# Patient Record
Sex: Male | Born: 1960 | Race: White | Hispanic: No | Marital: Married | Smoking: Never smoker
Health system: Southern US, Community
[De-identification: ages and names within clinical notes are randomized; demographics above are authoritative.]

## PROBLEM LIST (undated history)

## (undated) DIAGNOSIS — G510 Bell's palsy: Secondary | ICD-10-CM

## (undated) DIAGNOSIS — T7840XA Allergy, unspecified, initial encounter: Secondary | ICD-10-CM

## (undated) DIAGNOSIS — E785 Hyperlipidemia, unspecified: Secondary | ICD-10-CM

## (undated) DIAGNOSIS — R739 Hyperglycemia, unspecified: Secondary | ICD-10-CM

## (undated) HISTORY — DX: Allergy, unspecified, initial encounter: T78.40XA

## (undated) HISTORY — PX: VASECTOMY: SHX75

## (undated) HISTORY — PX: BACK SURGERY: SHX140

## (undated) HISTORY — DX: Bell's palsy: G51.0

---

## 2003-10-29 ENCOUNTER — Emergency Department (HOSPITAL_COMMUNITY): Admission: EM | Admit: 2003-10-29 | Discharge: 2003-10-29 | Payer: Self-pay | Admitting: Emergency Medicine

## 2007-03-17 ENCOUNTER — Encounter: Admission: RE | Admit: 2007-03-17 | Discharge: 2007-03-17 | Payer: Self-pay | Admitting: Family Medicine

## 2013-07-22 ENCOUNTER — Other Ambulatory Visit: Payer: Self-pay | Admitting: Family Medicine

## 2013-07-22 DIAGNOSIS — N509 Disorder of male genital organs, unspecified: Secondary | ICD-10-CM

## 2013-07-26 ENCOUNTER — Ambulatory Visit
Admission: RE | Admit: 2013-07-26 | Discharge: 2013-07-26 | Disposition: A | Discharge status: Home / Self Care | Payer: Managed Care, Other (non HMO) | Source: Ambulatory Visit | Attending: Family Medicine | Admitting: Family Medicine

## 2013-07-26 DIAGNOSIS — N509 Disorder of male genital organs, unspecified: Secondary | ICD-10-CM

## 2015-12-21 ENCOUNTER — Ambulatory Visit (INDEPENDENT_AMBULATORY_CARE_PROVIDER_SITE_OTHER): Payer: Managed Care, Other (non HMO) | Admitting: Family Medicine

## 2015-12-21 VITALS — BP 116/78 | HR 74 | Temp 98.7°F | Resp 18 | Ht 71.0 in | Wt 219.0 lb

## 2015-12-21 DIAGNOSIS — L923 Foreign body granuloma of the skin and subcutaneous tissue: Secondary | ICD-10-CM | POA: Insufficient documentation

## 2015-12-21 NOTE — Patient Instructions (Addendum)
Thank you for coming in today. That thorn can wait till after your golf trip.  We can remove it as can your PCP or your hand surgeon.   Return sooner if it becomes red and painful.     IF you received an x-ray today, you will receive an invoice from [facility]. Please contact [facility] at [phone] with questions or concerns regarding your invoice.   IF you received labwork today, you will receive an invoice from [facility]. Please contact [facility] at [phone] with questions or concerns regarding your invoice.   Our billing staff will not be able to assist you with questions regarding bills from these companies.  You will be contacted with the lab results as soon as they are available. The fastest way to get your results is to activate your My Chart account. Instructions are located on the last page of this paperwork. If you have not heard from us regarding the results in 2 weeks, please contact this office.

## 2015-12-21 NOTE — Progress Notes (Signed)
    Jeff KneeRobert Perez is a 55 y.o. male who presents to [facility] today for foreign body. 3 months ago patient had a throne become lodged in the radial side of the right index finger at the proximal torsion of the middle phalanx. He had a few days of mild redness and pain which has since resolved. Now he has a persistent bump at the same spot. It is very occasionally tender but does not particularly bother him very much. The coworker stated that this has to be dealt with right away because one of her friends died from an infected finger in the past.  He notes that he has an important golf trip coming up Monday (3 days from now) would like to delay surgical management if possible.   Past Medical History:  Diagnosis Date  . Allergy    Past Surgical History:  Procedure Laterality Date  . VASECTOMY     Social History  Substance Use Topics  . Smoking status: Never Smoker  . Smokeless tobacco: Never Used  . Alcohol use Yes   ROS as above Medications: No current outpatient prescriptions on file.   No current facility-administered medications for this visit.    No Known Allergies   Exam:  BP 116/78 (BP Location: Right Arm, Patient Position: Sitting, Cuff Size: Small)   Pulse 74   Temp 98.7 F (37.1 C) (Oral)   Resp 18   Ht 5\' 11"  (1.803 m)   Wt 219 lb (99.3 kg)   SpO2 98%   BMI 30.54 kg/m  Gen: Well NAD Right index finger. Small flesh-colored 1-2 mm papule radial side of the proximal end of the middle phalanx. Nontender no discharge or erythema. Normal finger motion.  No results found for this or any previous visit (from the past 24 hour(s)). No results found.  Assessment and Plan: 55 y.o. male with foreign body.  No signs of infection today. I discussed with the patient that this can certainly wait until a better time in his life for removal. I presented him with several options including return to this clinic for removal or follow-up with his PCP or his existing hand surgeon. He  thinks he'll follow-up with his hand surgeon for this issue at some point in the future.  Discussed warning signs or symptoms. Please see discharge instructions. Patient expresses understanding.

## 2018-12-06 ENCOUNTER — Encounter (HOSPITAL_COMMUNITY): Payer: Self-pay

## 2018-12-06 ENCOUNTER — Other Ambulatory Visit: Payer: Self-pay

## 2018-12-06 ENCOUNTER — Emergency Department (HOSPITAL_COMMUNITY): Payer: Managed Care, Other (non HMO)

## 2018-12-06 ENCOUNTER — Observation Stay (HOSPITAL_COMMUNITY)
Admission: EM | Admit: 2018-12-06 | Discharge: 2018-12-07 | Disposition: A | Discharge status: Home / Self Care | Payer: Managed Care, Other (non HMO) | Attending: Family Medicine | Admitting: Family Medicine

## 2018-12-06 ENCOUNTER — Inpatient Hospital Stay (HOSPITAL_COMMUNITY): Payer: Managed Care, Other (non HMO)

## 2018-12-06 DIAGNOSIS — G510 Bell's palsy: Secondary | ICD-10-CM | POA: Diagnosis not present

## 2018-12-06 DIAGNOSIS — R29810 Facial weakness: Secondary | ICD-10-CM | POA: Diagnosis present

## 2018-12-06 DIAGNOSIS — I639 Cerebral infarction, unspecified: Secondary | ICD-10-CM

## 2018-12-06 DIAGNOSIS — R739 Hyperglycemia, unspecified: Secondary | ICD-10-CM | POA: Diagnosis not present

## 2018-12-06 DIAGNOSIS — E785 Hyperlipidemia, unspecified: Secondary | ICD-10-CM | POA: Insufficient documentation

## 2018-12-06 DIAGNOSIS — I6381 Other cerebral infarction due to occlusion or stenosis of small artery: Secondary | ICD-10-CM

## 2018-12-06 DIAGNOSIS — I10 Essential (primary) hypertension: Secondary | ICD-10-CM | POA: Diagnosis not present

## 2018-12-06 DIAGNOSIS — Z20828 Contact with and (suspected) exposure to other viral communicable diseases: Secondary | ICD-10-CM | POA: Insufficient documentation

## 2018-12-06 DIAGNOSIS — Z79899 Other long term (current) drug therapy: Secondary | ICD-10-CM | POA: Diagnosis not present

## 2018-12-06 HISTORY — DX: Hyperlipidemia, unspecified: E78.5

## 2018-12-06 HISTORY — DX: Hyperglycemia, unspecified: R73.9

## 2018-12-06 LAB — I-STAT CHEM 8, ED
BUN: 14 mg/dL (ref 6–20)
Calcium, Ion: 1.24 mmol/L (ref 1.15–1.40)
Chloride: 103 mmol/L (ref 98–111)
Creatinine, Ser: 0.7 mg/dL (ref 0.61–1.24)
Glucose, Bld: 99 mg/dL (ref 70–99)
HCT: 40 % (ref 39.0–52.0)
Hemoglobin: 13.6 g/dL (ref 13.0–17.0)
Potassium: 3.8 mmol/L (ref 3.5–5.1)
Sodium: 140 mmol/L (ref 135–145)
TCO2: 25 mmol/L (ref 22–32)

## 2018-12-06 LAB — COMPREHENSIVE METABOLIC PANEL
ALT: 32 U/L (ref 0–44)
AST: 22 U/L (ref 15–41)
Albumin: 4.7 g/dL (ref 3.5–5.0)
Alkaline Phosphatase: 50 U/L (ref 38–126)
Anion gap: 10 (ref 5–15)
BUN: 16 mg/dL (ref 6–20)
CO2: 24 mmol/L (ref 22–32)
Calcium: 9 mg/dL (ref 8.9–10.3)
Chloride: 104 mmol/L (ref 98–111)
Creatinine, Ser: 0.78 mg/dL (ref 0.61–1.24)
GFR calc Af Amer: >60 mL/min (ref >60)
GFR calc non Af Amer: >60 mL/min (ref >60)
Glucose, Bld: 105 mg/dL — ABNORMAL HIGH (ref 70–99)
Potassium: 3.8 mmol/L (ref 3.5–5.1)
Sodium: 138 mmol/L (ref 135–145)
Total Bilirubin: 1 mg/dL (ref 0.3–1.2)
Total Protein: 7.1 g/dL (ref 6.5–8.1)

## 2018-12-06 LAB — URINALYSIS, ROUTINE W REFLEX MICROSCOPIC
Bilirubin Urine: NEGATIVE
Glucose, UA: NEGATIVE mg/dL
Hgb urine dipstick: NEGATIVE
Ketones, ur: NEGATIVE mg/dL
Leukocytes,Ua: NEGATIVE
Nitrite: NEGATIVE
Protein, ur: NEGATIVE mg/dL
Specific Gravity, Urine: 1.01 (ref 1.005–1.030)
pH: 8 (ref 5.0–8.0)

## 2018-12-06 LAB — PROTIME-INR
INR: 0.9 (ref 0.8–1.2)
Prothrombin Time: 12.2 seconds (ref 11.4–15.2)

## 2018-12-06 LAB — DIFFERENTIAL
Abs Immature Granulocytes: 0.05 10*3/uL (ref 0.00–0.07)
Basophils Absolute: 0 10*3/uL (ref 0.0–0.1)
Basophils Relative: 1 %
Eosinophils Absolute: 0.1 10*3/uL (ref 0.0–0.5)
Eosinophils Relative: 2 %
Immature Granulocytes: 1 %
Lymphocytes Relative: 30 %
Lymphs Abs: 1.6 10*3/uL (ref 0.7–4.0)
Monocytes Absolute: 0.5 10*3/uL (ref 0.1–1.0)
Monocytes Relative: 9 %
Neutro Abs: 2.9 10*3/uL (ref 1.7–7.7)
Neutrophils Relative %: 57 %

## 2018-12-06 LAB — CBC
HCT: 40 % (ref 39.0–52.0)
Hemoglobin: 13.3 g/dL (ref 13.0–17.0)
MCH: 31.6 pg (ref 26.0–34.0)
MCHC: 33.3 g/dL (ref 30.0–36.0)
MCV: 95 fL (ref 80.0–100.0)
Platelets: 186 10*3/uL (ref 150–400)
RBC: 4.21 MIL/uL — ABNORMAL LOW (ref 4.22–5.81)
RDW: 11.9 % (ref 11.5–15.5)
WBC: 5.1 10*3/uL (ref 4.0–10.5)
nRBC: 0 % (ref 0.0–0.2)

## 2018-12-06 LAB — RAPID URINE DRUG SCREEN, HOSP PERFORMED
Amphetamines: NOT DETECTED
Barbiturates: NOT DETECTED
Benzodiazepines: NOT DETECTED
Cocaine: NOT DETECTED
Opiates: NOT DETECTED
Tetrahydrocannabinol: NOT DETECTED

## 2018-12-06 LAB — SARS CORONAVIRUS 2 (TAT 6-24 HRS): SARS Coronavirus 2: NEGATIVE

## 2018-12-06 LAB — APTT: aPTT: 28 seconds (ref 24–36)

## 2018-12-06 MED ORDER — SODIUM CHLORIDE 0.9 % IV SOLN: 100.0000 mL/h | INTRAVENOUS | Status: DC

## 2018-12-06 MED ORDER — ACETAMINOPHEN 160 MG/5ML PO SOLN: 650.0000 mg | ORAL | Status: DC | PRN

## 2018-12-06 MED ORDER — SODIUM CHLORIDE 0.9 % IV BOLUS
500.0000 mL | Freq: Once | INTRAVENOUS | Status: AC
  Administered 2018-12-06: 500 mL via INTRAVENOUS

## 2018-12-06 MED ORDER — ASPIRIN 81 MG PO CHEW
324.0000 mg | CHEWABLE_TABLET | Freq: Once | ORAL | Status: AC
  Administered 2018-12-06: 324 mg via ORAL
  Filled 2018-12-06: qty 4

## 2018-12-06 MED ORDER — SODIUM CHLORIDE (PF) 0.9 % IJ SOLN
INTRAMUSCULAR | Status: AC
  Administered 2018-12-06: 20:00:00
  Filled 2018-12-06: qty 50

## 2018-12-06 MED ORDER — IOHEXOL 350 MG/ML SOLN
80.0000 mL | Freq: Once | INTRAVENOUS | Status: AC | PRN
  Administered 2018-12-06: 80 mL via INTRAVENOUS

## 2018-12-06 MED ORDER — ACETAMINOPHEN 650 MG RE SUPP: 650.0000 mg | RECTAL | Status: DC | PRN

## 2018-12-06 MED ORDER — ACETAMINOPHEN 325 MG PO TABS
650.0000 mg | ORAL_TABLET | ORAL | Status: DC | PRN
  Administered 2018-12-07 (×2): 650 mg via ORAL
  Filled 2018-12-06 (×3): qty 2

## 2018-12-06 NOTE — H&P (Addendum)
History and Physical  Jeff Perez URK:[id number] DOB: May 15, 1960 DOA: 12/06/2018  PCP: System, Pcp Not In   Chief Complaint: Left facial weakness  HPI:  58 year old man PMH hyperlipidemia, hyperglycemia, PRESENTED to the emergency department at [facility] long for left-sided facial weakness.  Admitted for stroke.  Patient has been feeling well lately, no issues yesterday.  He got up this morning and noticed what he thought was swelling of his lip.  He went for a long walk as it is typical for him.  He felt well during this with no weakness.  However he noticed increasing left facial weakness, called his physician who directed him to the emergency department.  Left facial weakness has progressed and he has difficulty closing his left eye now.  No difficulty speaking.  No weakness of arm or leg.  Some tingling right side.  No other complaints.  Chart review: . No recent hospitalizations  ED Course: Treated with IV fluids.  CT head negative but MRI did reveal stroke.  EDP exam showed left-sided facial weakness and mild decrease in right grip strength.  Review of Systems:  Negative for fever, visual changes, sore throat, rash, muscle aches, chest pain, shortness of breath, dysuria, bleeding, nausea, vomiting, abdominal pain.  PMH . Hyperlipidemia . Hyperglycemia untreated  PSH . Vasectomy  Social History . Drinks 2 shots of liquor a day and a glass of wine a day no drug use, non-smoker  Allergies . None  Family history includes: . Mother with diabetes   Meds include: . Lipitor 40 mg nightly  Physicial Exam   Vitals:  . 98.1, 22, 72, 73, 135/89, 98%  Constitutional:   . Appears calm and comfortable Eyes:  . pupils and irises appear normal . Normal lids  ENMT:  . grossly normal hearing  . Lips appear normal Neck:  . neck appears normal, no masses . no thyromegaly Respiratory:  . CTA bilaterally, no w/r/r.  . Respiratory effort normal.  Cardiovascular:  . RRR, no  m/r/g . No LE extremity edema   Abdomen:  . Abdomen appears normal; no tenderness or masses . No hernia noted Musculoskeletal:  . Grossly normal tone and strength all extremities, no focal deficit noted Skin:  . No rashes, lesions, ulcers . palpation of skin: no induration or nodules Neurologic:  . Left-sided facial weakness noted, inability to completely close left eye . Sensation bilateral lower extremities intact grossly Psychiatric:  . Mental status o Mood, affect appropriate . judgment and insight appear intact    I have personally reviewed following labs and imaging studies  Labs:  Marland Kitchen SARS-CoV-2 pending . BMP unremarkable . LFTs unremarkable . CBC unremarkable . UDS negative  Imaging studies:   CT head negative  Medical tests:   EKG independently reviewed: Sinus rhythm, LVH  Significant Hospital Events   . 10/5 admitted for acute stroke with left-sided facial weakness   Consults:  . Neurology   Procedures:  .   Significant Diagnostic Tests:  . 10/5 MRI brain: Possible subcentimeter acute right paramedian pontine infarct.   Micro Data:  .    Antimicrobials:  .   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  No need for CBG checks at this point.  Follow-up hemoglobin A1c.   DVT prophylaxis: early ambulation Code Status: Full Family Communication: wife at bedside Consults called: neurology  Time spent: 60 minutes  [name], MD  [facility] Direct contact: see [email or web address]  7PM-7AM contact night coverage as below   1. Check the care team in [facility] and look for a) attending/consulting [facility] provider listed and b) the [facility] team listed 2. Log into [email or web address] and use [facility]'s universal  password to access. If you do not have the password, please contact the hospital operator. 3. Locate the [facility] provider you are looking for under [facility] and page to a number that you can be directly reached. 4. If you still have difficulty reaching the provider, please page the [facility] (Director on Call) for the Hospitalists listed on amion for assistance.  Severity of Illness: The appropriate patient status for this patient is INPATIENT. Inpatient status is judged to be reasonable and necessary in order to provide the required intensity of service to ensure the patient's safety. The patient's presenting symptoms, physical exam findings, and initial radiographic and laboratory data in the context of their chronic comorbidities is felt to place them at high risk for further clinical deterioration. Furthermore, it is not anticipated that the patient will be medically stable for discharge from the hospital within 2 midnights of admission. The following factors support the patient status of inpatient.   " The patient's presenting symptoms include left facial weakness. " The worrisome physical exam findings include left facial weakness. " The initial radiographic and laboratory data are worrisome because of suspected stroke. " The chronic co-morbidities include hyperlipidemia.   * I certify that at the point of admission it is my clinical judgment that the patient will require inpatient hospital care spanning beyond 2 midnights from the point of admission due to high intensity of service, high risk for further deterioration and high frequency of surveillance required.*   12/06/2018, 5:48 PM   Active Problems:   CVA (cerebral vascular accident) ([place])   Hyperlipidemia

## 2018-12-06 NOTE — Consult Note (Addendum)
Neurology Consultation  Reason for Consult: Stroke Referring Physician: Dr. Irene LimboGoodrich  History is obtained from: Patient  HPI: Jeff KneeRobert Perez is a 58 y.o. male with hyperlipidemia and hyperglycemia as past medical history.  Patient states that he went to sleep at 2100 hrs. last night feeling fine.  When he woke up he felt as though the her left side of his lip felt fat.  As he was driving he felt as though his left eye was slightly droopy also.  There was also noted left facial droop.  In addition, noted numbness of the right hand and leg that has nearly resolved. Initial EDP exam concerning for right grip and UE subtle weakness.  MRI brain done that showed a possible pontine infarct. Neurology consulted for stroke. Denies family h/o strokes. Denies family h/o MI. Denies prior personal history of stroke.   ED course CT head and MRI head   LKW: 2100 hrs. on 12/05/2018 tpa given?: no, out of window for TPA Premorbid modified Rankin scale (mRS): 0 NIH stroke score of 3 ROS: ROS was performed and is negative except as noted in the HPI.   Past Medical History:  Diagnosis Date  . Allergy   . Hyperglycemia   . Hyperlipidemia     Family History  Problem Relation Age of Onset  . Diabetes Mother   . Stroke Mother   . Cancer Father   . Cancer Maternal Grandfather   . Diabetes Sister      Social History:   reports that he has never smoked. He has never used smokeless tobacco. He reports current alcohol use of about 21.0 standard drinks of alcohol per week. He reports that he does not use drugs.  Medications  Current Facility-Administered Medications:  .  [COMPLETED] sodium chloride 0.9 % bolus 500 mL, 500 mL, Intravenous, Once, Stopped at 12/06/18 1507 **FOLLOWED BY** 0.9 %  sodium chloride infusion, 100 mL/hr, Intravenous, Continuous, Gerhard MunchLockwood, Darral, MD  Current Outpatient Medications:  .  atorvastatin (LIPITOR) 40 MG tablet, Take 40 mg by mouth daily., Disp: , Rfl:     Exam: Current vital signs: BP (!) 148/92   Pulse 73   Temp 98.1 F (36.7 C)   Resp (!) 22   Ht 5\' 11"  (1.803 m)   Wt 97.1 kg   SpO2 95%   BMI 29.85 kg/m  Vital signs in last 24 hours: Temp:  [98.1 F (36.7 C)] 98.1 F (36.7 C) (10/05 1027) Pulse Rate:  [55-73] 73 (10/05 1730) Resp:  [13-22] 22 (10/05 1730) BP: (127-166)/(88-97) 148/92 (10/05 1730) SpO2:  [95 %-100 %] 95 % (10/05 1730) Weight:  [97.1 kg] 97.1 kg (10/05 1027)  Physical Exam  Constitutional: Appears well-developed and well-nourished.  Psych: Affect appropriate to situation Eyes: No scleral injection HENT: No OP obstrucion Head: Normocephalic.  Cardiovascular: Normal rate and regular rhythm.  Respiratory: Effort normal, non-labored breathing GI: Soft.  No distension. There is no tenderness.  Skin: WDI  Neuro: Mental Status: Patient is awake, alert, oriented to person, place, month, year, and situation. Patient is able to give a clear and coherent history. No signs of aphasia or neglect Cranial Nerves: II: Visual Fields are full.  III,IV, VI: EOMI without ptosis or diploplia. Pupils equal, round and reactive to light V: Facial sensation is symmetric to temperature VII: Patient has decreased movement of frontal temporalis muscle, along with nasolabial fold and facial droop VIII: hearing is intact to voice X: Palat elevates symmetrically XI: Shoulder shrug is symmetric. XII: tongue is  midline without atrophy or fasciculations.  Motor: Tone is normal. Bulk is normal. 5/5 strength was present in all four extremities.  Sensory: Mild diminished sensation of Rt hemibody waxing and waning. Deep Tendon Reflexes: 2+ and symmetric in the biceps and patellae.  Plantars: Toes are downgoing bilaterally.  Cerebellar: FNF and HKS are intact bilaterally  Labs I have reviewed labs in epic and the results pertinent to this consultation are:   CBC    Component Value Date/Time   WBC 5.1 12/06/2018 1121    RBC 4.21 (L) 12/06/2018 1121   HGB 13.6 12/06/2018 1129   HCT 40.0 12/06/2018 1129   PLT 186 12/06/2018 1121   MCV 95.0 12/06/2018 1121   MCH 31.6 12/06/2018 1121   MCHC 33.3 12/06/2018 1121   RDW 11.9 12/06/2018 1121   LYMPHSABS 1.6 12/06/2018 1121   MONOABS 0.5 12/06/2018 1121   EOSABS 0.1 12/06/2018 1121   BASOSABS 0.0 12/06/2018 1121    CMP     Component Value Date/Time   NA 140 12/06/2018 1129   K 3.8 12/06/2018 1129   CL 103 12/06/2018 1129   CO2 24 12/06/2018 1121   GLUCOSE 99 12/06/2018 1129   BUN 14 12/06/2018 1129   CREATININE 0.70 12/06/2018 1129   CALCIUM 9.0 12/06/2018 1121   PROT 7.1 12/06/2018 1121   ALBUMIN 4.7 12/06/2018 1121   AST 22 12/06/2018 1121   ALT 32 12/06/2018 1121   ALKPHOS 50 12/06/2018 1121   BILITOT 1.0 12/06/2018 1121   GFRNONAA >60 12/06/2018 1121   GFRAA >60 12/06/2018 1121   Imaging I have reviewed the images obtained: CT-scan of the brain- no acute abnormalities are identified to explain patient's symptoms MRI examination of the brain- subcentimeter acute right paramedian pontine infarct  CTA of head and neck-pending  Echocardiogram-pending  IMA globin A1c and LDL pending  Felicie Morn PA-C [facility] [phone]  M-F  (9:00 am- 5:00 PM)  12/06/2018, 5:46 PM    Assessment: This is a 58 year old male presented to the hospital secondary to new onset left facial droop and right sided weakness upon awakening.   MRI confirms a subcentimeter acute right paramedian pontine infarct.  At this point patient is not on aspirin and will need to be placed on aspirin daily.  Impression: -Left facial droop -Stroke  Recommend # MRI of the brain without contrast #CTA of head and neck-ordered #Transthoracic Echo,   # Start patient on ASA 325mg  daily,   #Start or continue Atorvastatin 80 mg/other high intensity statin # BP goal: permissive HTN upto 220/120 mmHg # HBAIC and Lipid profile # Telemetry monitoring # Frequent  neuro checks # NPO until passes stroke swallow screen # please page stroke NP  Or  PA  Or MD from 8am -4 pm  as this patient from this time will be  followed by the stroke.   You can look them up on [email or web address]  Password [facility]   Attending Neurohospitalist Addendum Patient seen and examined with APP/Resident. Agree with the history and physical as documented above. Agree with the plan as documented, which I helped formulate. I have independently reviewed the chart, obtained history, review of systems and examined the patient.I have personally reviewed pertinent head/neck/spine imaging (CT/MRI). Exam findings of whole left face weakness - including upper face. Brainstem stroke more likely given crossed findings on face and body and imgaing confirms pontine stroke.  Less likely isolated Bells given imaging, and right hemibody symptoms.  Please feel free to call with any  questions. --- Amie Portland, MD [facility] Pager: [phone]  If 7pm to 7am, please call on call as listed on AMION.

## 2018-12-06 NOTE — ED Triage Notes (Signed)
Pt woke up with numbness on right side of face, mostly in lip and eyelid. Pt is otherwise negative on stroke scale . Pt speech is clear. No previous hx of bells palsy. Pt woke up at approximately 0400.

## 2018-12-06 NOTE — ED Provider Notes (Signed)
[facility]-EMERGENCY DEPT Provider Note   CSN: [id number] Arrival date & time: 12/06/18  1017     History   Chief Complaint Chief Complaint  Patient presents with  . Facial Droop    HPI Jeff KneeRobert Perez is a 58 y.o. male.     HPI Patient presents with new left-sided facial droop.  Patient was well yesterday, went to sleep. He awoke this morning a few hours prior to ED arrival, noticed odd sensation in the right side of his face, particularly in the right superior lip. With some difficulty elevating his eyelid, he noticed these differences throughout the interval hours. He was able to exercise, but after going to work, did speak with the physician, he was sent here for evaluation. He has no history of stroke, cardiac disease, does take a statin for hyperlipidemia. No recent medication, diet, activity change. No new weakness in any extremity.  He does, however, have tingling sensation in the distal right toes and fingertips. Past Medical History:  Diagnosis Date  . Allergy     Patient Active Problem List   Diagnosis Date Noted  . Foreign body, granuloma, skin 12/21/2015    Past Surgical History:  Procedure Laterality Date  . VASECTOMY          Home Medications    Prior to Admission medications   Medication Sig Start Date End Date Taking? Authorizing Provider  atorvastatin (LIPITOR) 40 MG tablet Take 40 mg by mouth daily. 11/11/18   [provider]    Family History Family History  Problem Relation Age of Onset  . Diabetes Mother   . Stroke Mother   . Cancer Father   . Cancer Maternal Grandfather   . Diabetes Sister     Social History Social History   Tobacco Use  . Smoking status: Never Smoker  . Smokeless tobacco: Never Used  Substance Use Topics  . Alcohol use: Yes  . Drug use: No     Allergies   Patient has no known allergies.   Review of Systems Review of Systems  Constitutional:       Per HPI, otherwise  negative  HENT:       Per HPI, otherwise negative  Respiratory:       Per HPI, otherwise negative  Cardiovascular:       Per HPI, otherwise negative  Gastrointestinal: Negative for vomiting.  Endocrine:       Negative aside from HPI  Genitourinary:       Neg aside from HPI   Musculoskeletal:       Per HPI, otherwise negative  Skin: Negative.   Neurological: Positive for facial asymmetry. Negative for syncope.     Physical Exam Updated Vital Signs BP 135/89   Pulse 62   Temp 98.1 F (36.7 C)   Resp 20   Ht 5\' 11"  (1.803 m)   Wt 97.1 kg   SpO2 98%   BMI 29.85 kg/m   Physical Exam Vitals signs and nursing note reviewed.  Constitutional:      General: He is not in acute distress.    Appearance: He is well-developed.  HENT:     Head: Normocephalic and atraumatic.  Eyes:     Conjunctiva/sclera: Conjunctivae normal.  Cardiovascular:     Rate and Rhythm: Normal rate and regular rhythm.  Pulmonary:     Effort: Pulmonary effort is normal. No respiratory distress.     Breath sounds: No stridor.  Abdominal:     General: There  is no distension.  Skin:    General: Skin is warm and dry.  Neurological:     Mental Status: He is alert and oriented to person, place, and time.     Cranial Nerves: Facial asymmetry present.     Comments: Left facial droop with loss of nasolabial fold. Right grip strength is 4+/5, left 5/5 Sensation preserved throughout, neurologic exam otherwise unremarkable.      ED Treatments / Results  Labs (all labs ordered are listed, but only abnormal results are displayed) Labs Reviewed  CBC - Abnormal; Notable for the following components:      Result Value   RBC 4.21 (*)    All other components within normal limits  COMPREHENSIVE METABOLIC PANEL - Abnormal; Notable for the following components:   Glucose, Bld 105 (*)    All other components within normal limits  URINALYSIS, ROUTINE W REFLEX MICROSCOPIC - Abnormal; Notable for the following  components:   Color, Urine STRAW (*)    All other components within normal limits  SARS CORONAVIRUS 2 (TAT 6-24 HRS)  PROTIME-INR  APTT  DIFFERENTIAL  RAPID URINE DRUG SCREEN, HOSP PERFORMED  HIV ANTIBODY (ROUTINE TESTING W REFLEX)  HEMOGLOBIN A1C  LIPID PANEL  GLUCOSE, CAPILLARY  GLUCOSE, CAPILLARY  HIV4GL SAVE TUBE  I-STAT CHEM 8, ED    EKG EKG Interpretation  Date/Time:  Monday December 06 2018 13:03:16 EDT Ventricular Rate:  60 PR Interval:    QRS Duration: 96 QT Interval:  438 QTC Calculation: 438 R Axis:   -49 Text Interpretation:  Sinus rhythm LAD, consider left anterior fascicular block Abnormal R-wave progression, late transition Left ventricular hypertrophy No old tracing to compare Confirmed by Delora Fuel ([id number]) on 12/07/2018 2:04:09 AM   RadiologyCt Head Wo Contrast  Result Date: 12/06/2018 CLINICAL DATA:  Patient woke up this morning with numbness in right face. EXAM: CT HEAD WITHOUT CONTRAST TECHNIQUE: Contiguous axial images were obtained from the base of the skull through the vertex without intravenous contrast. COMPARISON:  None. FINDINGS: Brain: No evidence of acute infarction, hemorrhage, hydrocephalus, extra-axial collection or mass lesion/mass effect. Vascular: No hyperdense vessel or unexpected calcification. Skull: Normal. Negative for fracture or focal lesion. Sinuses/Orbits: No acute finding. Other: None. IMPRESSION: No acute abnormalities are identified to explain the patient's symptoms. Electronically Signed   By: Dorise Bullion III M.D   On: 12/06/2018 11:55   Mr Brain Wo Contrast  Result Date: 12/06/2018 CLINICAL DATA:  Right facial numbness. EXAM: MRI HEAD WITHOUT CONTRAST TECHNIQUE: Multiplanar, multiecho pulse sequences of the brain and surrounding structures were obtained without intravenous contrast. COMPARISON:  Head CT 12/06/2018 FINDINGS: Brain: There is a subcentimeter curvilinear focus of trace diffusion signal hyperintensity in the right  paramedian pons on the axial diffusion series (series 4, images 12 and 13) with suggestion of reduced ADC, however this is an area prone to artifact and an infarct is not clearly confirmed on the coronal diffusion sequence. There is no corresponding sold abnormality in this location on conventional sequences. The brain is otherwise normal in signal. The ventricles and sulci are normal. Vascular: Major intracranial vascular flow voids are preserved. Skull and upper cervical spine: Unremarkable bone marrow signal. Sinuses/Orbits: Unremarkable orbits. Paranasal sinuses and mastoid air cells are clear. Other: None. IMPRESSION: 1. Possible subcentimeter acute right paramedian pontine infarct. 2. Otherwise unremarkable appearance of the brain. Electronically Signed   By: Logan Bores M.D.   On: 12/06/2018 12:46    Procedures Procedures (including critical care time)  Medications Ordered in ED Medications  sodium chloride 0.9 % bolus 500 mL (has no administration in time range)    Followed by  0.9 %  sodium chloride infusion (has no administration in time range)     Initial Impression / Assessment and Plan / ED Course  I have reviewed the triage vital signs and the nursing notes.  Pertinent labs & imaging results that were available during my care of the patient were reviewed by me and considered in my medical decision making (see chart for details).    11:41 AM Head CT does not demonstrate mass, nor bleed.   On repeat exam the patient is accompanied by his wife.  We discussed the MRI findings and with his PE notable for L facial droop, R grip weakness, likelihood of stroke.  He has no other new notable complaints (though he does have some subjective difficulty opening his L eye - when distracted, this has normal function.)  I have also d/w neuro and given concern for CVA he will be transferred [facility]/M.  Aspirin started per rec's.  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

## 2018-12-06 NOTE — ED Notes (Signed)
Dr. Pfeiffer made aware of pt.  

## 2018-12-06 NOTE — ED Notes (Signed)
Patient returned from MRI.

## 2018-12-06 NOTE — ED Notes (Signed)
Patient transported to MRI 

## 2018-12-07 ENCOUNTER — Inpatient Hospital Stay (HOSPITAL_COMMUNITY): Payer: Managed Care, Other (non HMO)

## 2018-12-07 ENCOUNTER — Other Ambulatory Visit: Payer: Self-pay

## 2018-12-07 DIAGNOSIS — I6389 Other cerebral infarction: Secondary | ICD-10-CM

## 2018-12-07 DIAGNOSIS — Z79899 Other long term (current) drug therapy: Secondary | ICD-10-CM | POA: Diagnosis not present

## 2018-12-07 DIAGNOSIS — Z20828 Contact with and (suspected) exposure to other viral communicable diseases: Secondary | ICD-10-CM | POA: Diagnosis not present

## 2018-12-07 DIAGNOSIS — R29810 Facial weakness: Secondary | ICD-10-CM | POA: Diagnosis present

## 2018-12-07 DIAGNOSIS — I639 Cerebral infarction, unspecified: Secondary | ICD-10-CM | POA: Diagnosis present

## 2018-12-07 DIAGNOSIS — G510 Bell's palsy: Secondary | ICD-10-CM

## 2018-12-07 DIAGNOSIS — E785 Hyperlipidemia, unspecified: Secondary | ICD-10-CM | POA: Diagnosis not present

## 2018-12-07 DIAGNOSIS — R739 Hyperglycemia, unspecified: Secondary | ICD-10-CM | POA: Diagnosis not present

## 2018-12-07 DIAGNOSIS — I10 Essential (primary) hypertension: Secondary | ICD-10-CM | POA: Diagnosis not present

## 2018-12-07 LAB — HEMOGLOBIN A1C
Hgb A1c MFr Bld: 5.4 % (ref 4.8–5.6)
Mean Plasma Glucose: 108.28 mg/dL

## 2018-12-07 LAB — GLUCOSE, CAPILLARY
Glucose-Capillary: 83 mg/dL (ref 70–99)
Glucose-Capillary: 98 mg/dL (ref 70–99)

## 2018-12-07 LAB — LIPID PANEL
Cholesterol: 173 mg/dL (ref 0–200)
HDL: 85 mg/dL (ref >40)
LDL Cholesterol: 75 mg/dL (ref 0–99)
Total CHOL/HDL Ratio: 2 RATIO
Triglycerides: 63 mg/dL (ref <150)
VLDL: 13 mg/dL (ref 0–40)

## 2018-12-07 LAB — HIV ANTIBODY (ROUTINE TESTING W REFLEX): HIV Screen 4th Generation wRfx: NONREACTIVE

## 2018-12-07 LAB — ECHOCARDIOGRAM COMPLETE
Height: 71 in
Weight: 3424 oz

## 2018-12-07 MED ORDER — ENOXAPARIN SODIUM 40 MG/0.4ML ~~LOC~~ SOLN
40.0000 mg | SUBCUTANEOUS | Status: DC
  Administered 2018-12-07: 40 mg via SUBCUTANEOUS
  Filled 2018-12-07: qty 0.4

## 2018-12-07 MED ORDER — VALACYCLOVIR HCL 500 MG PO TABS
1000.0000 mg | ORAL_TABLET | Freq: Three times a day (TID) | ORAL | Status: DC
  Administered 2018-12-07: 17:00:00 1000 mg via ORAL
  Filled 2018-12-07: qty 2

## 2018-12-07 MED ORDER — SENNOSIDES-DOCUSATE SODIUM 8.6-50 MG PO TABS: 1.0000 | ORAL_TABLET | Freq: Every evening | ORAL | Status: DC | PRN

## 2018-12-07 MED ORDER — ARTIFICIAL TEARS OPHTHALMIC OINT
TOPICAL_OINTMENT | Freq: Every evening | OPHTHALMIC | Status: DC | PRN
  Filled 2018-12-07: qty 3.5

## 2018-12-07 MED ORDER — PREDNISONE 20 MG PO TABS: 60.0000 mg | ORAL_TABLET | Freq: Every day | ORAL | Status: DC

## 2018-12-07 MED ORDER — HYPROMELLOSE (GONIOSCOPIC) 2.5 % OP SOLN: 1.0000 [drp] | Freq: Four times a day (QID) | OPHTHALMIC | 12 refills | Status: DC | PRN

## 2018-12-07 MED ORDER — STROKE: EARLY STAGES OF RECOVERY BOOK
Freq: Once | Status: AC
  Administered 2018-12-07: 04:00:00
  Filled 2018-12-07: qty 1

## 2018-12-07 MED ORDER — HYPROMELLOSE (GONIOSCOPIC) 2.5 % OP SOLN
1.0000 [drp] | Freq: Four times a day (QID) | OPHTHALMIC | Status: DC | PRN
  Filled 2018-12-07: qty 15

## 2018-12-07 MED ORDER — ATORVASTATIN CALCIUM 40 MG PO TABS
40.0000 mg | ORAL_TABLET | Freq: Every day | ORAL | Status: DC
  Administered 2018-12-07: 10:00:00 40 mg via ORAL
  Filled 2018-12-07 (×2): qty 1

## 2018-12-07 MED ORDER — ASPIRIN EC 81 MG PO TBEC
81.0000 mg | DELAYED_RELEASE_TABLET | Freq: Every day | ORAL | Status: DC
  Administered 2018-12-07: 81 mg via ORAL
  Filled 2018-12-07: qty 1

## 2018-12-07 MED ORDER — CLOPIDOGREL BISULFATE 75 MG PO TABS
75.0000 mg | ORAL_TABLET | Freq: Every day | ORAL | Status: DC
  Administered 2018-12-07: 10:00:00 75 mg via ORAL
  Filled 2018-12-07: qty 1

## 2018-12-07 MED ORDER — PREDNISONE 20 MG PO TABS: 60.0000 mg | ORAL_TABLET | Freq: Every day | ORAL | 0 refills | Status: AC

## 2018-12-07 MED ORDER — VALACYCLOVIR HCL 1 G PO TABS: 1000.0000 mg | ORAL_TABLET | Freq: Three times a day (TID) | ORAL | 0 refills | Status: AC

## 2018-12-07 MED ORDER — PREDNISONE 20 MG PO TABS
60.0000 mg | ORAL_TABLET | Freq: Every day | ORAL | Status: DC
  Administered 2018-12-07: 17:00:00 60 mg via ORAL
  Filled 2018-12-07: qty 3

## 2018-12-07 MED ORDER — ARTIFICIAL TEARS OPHTHALMIC OINT: TOPICAL_OINTMENT | Freq: Every evening | OPHTHALMIC | 1 refills | Status: AC | PRN

## 2018-12-07 MED ORDER — ASPIRIN 325 MG PO TABS: 325.0000 mg | ORAL_TABLET | Freq: Every day | ORAL | Status: DC

## 2018-12-07 MED ORDER — ASPIRIN 300 MG RE SUPP: 300.0000 mg | Freq: Every day | RECTAL | Status: DC

## 2018-12-07 NOTE — Progress Notes (Signed)
Patient being discharged home with self care. Education and instructions provided to . All belongings with pt. IV removed. CCMD notified. Patient leaving unit ambulating with RN.

## 2018-12-07 NOTE — Discharge Summary (Signed)
Physician Discharge Summary  Jeff Perez VVO:[id number] DOB: [birth year] DOA: 12/06/2018  PCP: System, Pcp Not In  Admit date: 12/06/2018 Discharge date: 12/07/2018  Admitted From: Home Disposition: Home  Recommendations for Outpatient Follow-up:  1. Follow up with PCP in 1 week 2. Follow up with neurology 3. Please follow up on the following pending results: None  Home Health: None Equipment/Devices: None  Discharge Condition: Stable CODE STATUS: Full code Diet recommendation: Heart healthy   Brief/Interim Summary:  Admission HPI written by Standley Brooking, MD   HPI:  58 year old man PMH hyperlipidemia, hyperglycemia, PRESENTED to the emergency department at [facility] long for left-sided facial weakness.  Admitted for stroke.  Patient has been feeling well lately, no issues yesterday.  He got up this morning and noticed what he thought was swelling of his lip.  He went for a long walk as it is typical for him.  He felt well during this with no weakness.  However he noticed increasing left facial weakness, called his physician who directed him to the emergency department.  Left facial weakness has progressed and he has difficulty closing his left eye now.  No difficulty speaking.  No weakness of arm or leg.  Some tingling right side.  No other complaints.   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 Repeat MRI brain significant for no acute infarct. Clinically consistent with Bell Palsy. Prescribed a steroid and valacyclovir course for 7 days on discharge. Outpatient follow-up with PCP and neurology. Transthoracic Echocardiogram results below.  Hyperlipidemia -Continue Lipitor  Hyperglycemia Hemoglobin A1C of 5.4%.  Discharge Diagnoses:  Active Problems:   Bell palsy   Hyperlipidemia    Discharge Instructions  Discharge Instructions    Ambulatory referral to Neurology   Complete by: As directed    Any provider for bell's palsy consult within 4 weeks. Thanks.     Allergies as of 12/07/2018   No Known Allergies     Medication List    TAKE these medications   atorvastatin 40 MG tablet Commonly known as: LIPITOR Take 40 mg by mouth daily.   predniSONE 20 MG tablet Commonly known as: DELTASONE Take 3 tablets (60 mg total) by mouth daily with breakfast for 6 days. Start taking on: December 08, 2018   valACYclovir 1000 MG tablet Commonly known as: VALTREX Take 1 tablet (1,000 mg total) by mouth 3 (three) times daily for 7 days.      Follow-up Information    [facility]. Schedule an appointment as soon as possible for a visit in 4 week(s).   Specialty: Neurology Contact information: [address] [place] [address] [phone]         No Known Allergies  Consultations:  Neurology   Procedures/Studies: Ct Angio Head W Or Wo Contrast  Result Date: 12/06/2018 CLINICAL DATA:  Left facial droop. Right upper extremity weakness. Possible infarct right pons on MRI today. EXAM: CT ANGIOGRAPHY HEAD AND NECK TECHNIQUE: Multidetector CT imaging of the head and neck was performed using the standard protocol during bolus administration of intravenous contrast. Multiplanar CT image reconstructions and MIPs were obtained to evaluate the vascular anatomy. Carotid stenosis measurements (when applicable) are obtained utilizing NASCET criteria, using the distal internal carotid diameter as the denominator. CONTRAST:  26mL OMNIPAQUE IOHEXOL 350 MG/ML SOLN COMPARISON:  MRI head 12/06/2018 FINDINGS: CT HEAD FINDINGS Brain: No evidence of acute infarction, hemorrhage, hydrocephalus, extra-axial collection or mass lesion/mass  effect. Vascular: Negative for hyperdense vessel Skull: Negative Sinuses: Negative Orbits: Negative Review of the MIP images confirms the above findings CTA NECK FINDINGS Aortic arch: Standard branching. Imaged portion shows no  evidence of aneurysm or dissection. No significant stenosis of the major arch vessel origins. Right carotid system: Normal right carotid. No atherosclerotic disease dissection or stenosis Left carotid system: Normal left carotid. No atherosclerotic disease, stenosis, or dissection Vertebral arteries: Right vertebral artery dominant and widely patent. Small left vertebral artery patent without significant stenosis. Skeleton: Mild disc degeneration and spurring C6-7. Periapical lucency around left lower molar. Other neck: Negative for mass or adenopathy in the neck. Upper chest: Negative Review of the MIP images confirms the above findings CTA HEAD FINDINGS Anterior circulation: Internal carotid artery widely patent bilaterally. 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 Anterior and middle cerebral arteries widely patent without significant stenosis. Posterior circulation: Right vertebral artery dominant. Small left vertebral artery patent to the basilar. PICA patent bilaterally. Small proximal basilar due to persistent trigeminal artery on the left. Superior cerebellar and posterior cerebral arteries patent bilaterally without stenosis. Venous sinuses: Normal venous enhancement. Anatomic variants: None Review of the MIP images confirms the above findings IMPRESSION: 1. No significant carotid or vertebral artery stenosis in the neck 2. No significant intracranial large vessel occlusion or significant stenosis. 3. Persistent trigeminal artery on the left, a congenital variation. Electronically Signed   By: Marlan Palau M.D.   On: 12/06/2018 20:43   Ct Head Wo Contrast  Result Date: 12/06/2018 CLINICAL DATA:  Patient woke up this morning with numbness in right face. EXAM: CT HEAD WITHOUT CONTRAST TECHNIQUE: Contiguous axial images were obtained from the base of the skull through the vertex without intravenous contrast. COMPARISON:  None. FINDINGS: Brain: No evidence of acute infarction,  hemorrhage, hydrocephalus, extra-axial collection or mass lesion/mass effect. Vascular: No hyperdense vessel or unexpected calcification. Skull: Normal. Negative for fracture or focal lesion. Sinuses/Orbits: No acute finding. Other: None. IMPRESSION: No acute abnormalities are identified to explain the patient's symptoms. Electronically Signed   By: Gerome Sam III M.D   On: 12/06/2018 11:55   Ct Angio Neck W Or Wo Contrast  Result Date: 12/06/2018 CLINICAL DATA:  Left facial droop. Right upper extremity weakness. Possible infarct right pons on MRI today. EXAM: CT ANGIOGRAPHY HEAD AND NECK TECHNIQUE: Multidetector CT imaging of the head and neck was performed using the standard protocol during bolus administration of intravenous contrast. Multiplanar CT image reconstructions and MIPs were obtained to evaluate the vascular anatomy. Carotid stenosis measurements (when applicable) are obtained utilizing NASCET criteria, using the distal internal carotid diameter as the denominator. CONTRAST:  80mL OMNIPAQUE IOHEXOL 350 MG/ML SOLN COMPARISON:  MRI head 12/06/2018 FINDINGS: CT HEAD FINDINGS Brain: No evidence of acute infarction, hemorrhage, hydrocephalus, extra-axial collection or mass lesion/mass effect. Vascular: Negative for hyperdense vessel Skull: Negative Sinuses: Negative Orbits: Negative Review of the MIP images confirms the above findings CTA NECK FINDINGS Aortic arch: Standard branching. Imaged portion shows no evidence of aneurysm or dissection. No significant stenosis of the major arch vessel origins. Right carotid system: Normal right carotid. No atherosclerotic disease dissection or stenosis Left carotid system: Normal left carotid. No atherosclerotic disease, stenosis, or dissection Vertebral arteries: Right vertebral artery dominant and widely patent. Small left vertebral artery patent without significant stenosis. Skeleton: Mild disc degeneration and spurring C6-7. Periapical lucency around left  lower molar. Other neck: Negative for mass or adenopathy in the neck. Upper chest: Negative Review  of the MIP images confirms the above findings CTA HEAD FINDINGS Anterior circulation: Internal carotid artery widely patent bilaterally. 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 Anterior and middle cerebral arteries widely patent without significant stenosis. Posterior circulation: Right vertebral artery dominant. Small left vertebral artery patent to the basilar. PICA patent bilaterally. Small proximal basilar due to persistent trigeminal artery on the left. Superior cerebellar and posterior cerebral arteries patent bilaterally without stenosis. Venous sinuses: Normal venous enhancement. Anatomic variants: None Review of the MIP images confirms the above findings IMPRESSION: 1. No significant carotid or vertebral artery stenosis in the neck 2. No significant intracranial large vessel occlusion or significant stenosis. 3. Persistent trigeminal artery on the left, a congenital variation. Electronically Signed   By: Marlan Palau M.D.   On: 12/06/2018 20:43   Mr Brain Wo Contrast  Result Date: 12/07/2018 CLINICAL DATA:  Rule out stroke.  Left lip symptoms. EXAM: MRI HEAD WITHOUT CONTRAST TECHNIQUE: Multiplanar, multiecho pulse sequences of the brain and surrounding structures were obtained without intravenous contrast. COMPARISON:  MRI head tense 07/2018 FINDINGS: Limited in focus study of the brain to rule out acute infarct in the pons as questioned on the MRI from 12/06/2018. Thin-section coronal and axial diffusion weighted imaging performed as well as axial FLAIR and T2. Negative for acute infarct in the pons as questioned yesterday. No acute infarct elsewhere in the brain. Ventricle size normal.  Normal white matter. Normal arterial flow voids. Paranasal sinuses and orbit negative. IMPRESSION: Negative for acute infarct with particular attention to the brainstem. Electronically Signed    By: Marlan Palau M.D.   On: 12/07/2018 14:05   Mr Brain Wo Contrast  Result Date: 12/06/2018 CLINICAL DATA:  Right facial numbness. EXAM: MRI HEAD WITHOUT CONTRAST TECHNIQUE: Multiplanar, multiecho pulse sequences of the brain and surrounding structures were obtained without intravenous contrast. COMPARISON:  Head CT 12/06/2018 FINDINGS: Brain: There is a subcentimeter curvilinear focus of trace diffusion signal hyperintensity in the right paramedian pons on the axial diffusion series (series 4, images 12 and 13) with suggestion of reduced ADC, however this is an area prone to artifact and an infarct is not clearly confirmed on the coronal diffusion sequence. There is no corresponding sold abnormality in this location on conventional sequences. The brain is otherwise normal in signal. The ventricles and sulci are normal. Vascular: Major intracranial vascular flow voids are preserved. Skull and upper cervical spine: Unremarkable bone marrow signal. Sinuses/Orbits: Unremarkable orbits. Paranasal sinuses and mastoid air cells are clear. Other: None. IMPRESSION: 1. Possible subcentimeter acute right paramedian pontine infarct. 2. Otherwise unremarkable appearance of the brain. Electronically Signed   By: Sebastian Ache M.D.   On: 12/06/2018 12:46     10/6: Transthoracic Echocardiogram IMPRESSIONS    1. Left ventricular ejection fraction, by visual estimation, is 60 to 65%. The left ventricle has normal function. Normal left ventricular size. There is no left ventricular hypertrophy.  2. Global right ventricle has normal systolic function.The right ventricular size is normal. No increase in right ventricular wall thickness.  3. Left atrial size was normal.  4. Right atrial size was normal.  5. The mitral valve is normal in structure. No evidence of mitral valve regurgitation. No evidence of mitral stenosis.  6. The tricuspid valve is normal in structure. Tricuspid valve regurgitation was not visualized  by color flow Doppler.  7. The aortic valve is normal in structure. Aortic valve regurgitation was not visualized by color flow Doppler. Structurally normal aortic  valve, with no evidence of sclerosis or stenosis.  8. The pulmonic valve was normal in structure. Pulmonic valve regurgitation is not visualized by color flow Doppler.  9. TR signal is inadequate for assessing pulmonary artery systolic pressure. 10. The inferior vena cava is normal in size with greater than 50% respiratory variability, suggesting right atrial pressure of 3 mmHg.  FINDINGS  Left Ventricle: Left ventricular ejection fraction, by visual estimation, is 60 to 65%. The left ventricle has normal function. No evidence of left ventricular regional wall motion abnormalities. There is no left ventricular hypertrophy. Normal left  ventricular size. Spectral Doppler shows Left ventricular diastolic parameters were normal pattern of LV diastolic filling. Normal left ventricular filling pressures.  Right Ventricle: The right ventricular size is normal. No increase in right ventricular wall thickness. Global RV systolic function is has normal systolic function.  Left Atrium: Left atrial size was normal in size.  Right Atrium: Right atrial size was normal in size  Pericardium: There is no evidence of pericardial effusion.  Mitral Valve: The mitral valve is normal in structure. No evidence of mitral valve stenosis by observation. No evidence of mitral valve regurgitation.  Tricuspid Valve: The tricuspid valve is normal in structure. Tricuspid valve regurgitation was not visualized by color flow Doppler.  Aortic Valve: The aortic valve is normal in structure. Aortic valve regurgitation was not visualized by color flow Doppler. The aortic valve is structurally normal, with no evidence of sclerosis or stenosis.  Pulmonic Valve: The pulmonic valve was normal in structure. Pulmonic valve regurgitation is not visualized by color  flow Doppler.  Aorta: The aortic root, ascending aorta and aortic arch are all structurally normal, with no evidence of dilitation or obstruction.  Venous: The inferior vena cava is normal in size with greater than 50% respiratory variability, suggesting right atrial pressure of 3 mmHg.  IAS/Shunts: No atrial level shunt detected by color flow Doppler. No ventricular septal defect is seen or detected. There is no evidence of an atrial septal defect.   Subjective: Patient with continued facial droop. He has some trouble controlling saliva spillage from left side of mouth. No weakness. Had prior paresthesias of right side  Discharge Exam: Vitals:   12/07/18 0810 12/07/18 1229  BP: (!) 127/92 (!) 131/97  Pulse: (!) 56 63  Resp: 17 17  Temp: 98.6 F (37 C) 98.8 F (37.1 C)  SpO2: 97% 97%   Vitals:   12/07/18 0312 12/07/18 0400 12/07/18 0810 12/07/18 1229  BP: 131/86  (!) 127/92 (!) 131/97  Pulse: 62  (!) 56 63  Resp: 16 16 17 17   Temp: 97.8 F (36.6 C)  98.6 F (37 C) 98.8 F (37.1 C)  TempSrc: Oral  Oral Oral  SpO2: 99%  97% 97%  Weight:      Height:        General exam: Appears calm and comfortable Respiratory system: Clear to auscultation. Respiratory effort normal. Cardiovascular system: S1 & S2 heard, RRR. No murmurs, rubs, gallops or clicks. Gastrointestinal system: Abdomen is nondistended, soft and nontender. No organomegaly or masses felt. Normal bowel sounds heard. Central nervous system: Alert and oriented. Left sided facial droop. Unable to fully close left eyelid when closing both eyes. No forehead wrinkles on left side. Extremities: No edema. No calf tenderness Skin: No cyanosis. No rashes Psychiatry: Judgement and insight appear normal. Mood & affect appropriate.   The results of significant diagnostics from this hospitalization (including imaging, microbiology, ancillary and laboratory) are listed below for  reference.     Microbiology: Recent Results  (from the past 240 hour(s))  SARS CORONAVIRUS 2 (TAT 6-24 HRS) Nasopharyngeal Nasopharyngeal Swab     Status: None   Collection Time: 12/06/18  3:21 PM   Specimen: Nasopharyngeal Swab  Result Value Ref Range Status   SARS Coronavirus 2 NEGATIVE NEGATIVE Final    Comment: (NOTE) SARS-CoV-2 target nucleic acids are NOT DETECTED. The SARS-CoV-2 RNA is generally detectable in upper and lower respiratory specimens during the acute phase of infection. Negative results do not preclude SARS-CoV-2 infection, do not rule out co-infections with other pathogens, and should not be used as the sole basis for treatment or other patient management decisions. Negative results must be combined with clinical observations, patient history, and epidemiological information. The expected result is Negative. Fact Sheet for Patients: HairSlick.no Fact Sheet for Healthcare Providers: quierodirigir.com This test is not yet approved or cleared by the [place] FDA and  has been authorized for detection and/or diagnosis of SARS-CoV-2 by FDA under an Emergency Use Authorization (EUA). This EUA will remain  in effect (meaning this test can be used) for the duration of the COVID-19 declaration under Section 56 4(b)(1) of the Act, 21 U.S.C. section 360bbb-3(b)(1), unless the authorization is terminated or revoked sooner. Performed at [facility], [address] [place], [place] [address]      Labs: BNP (last 3 results) No results for input(s): BNP in the last 8760 hours. Basic Metabolic Panel: Recent Labs  Lab 12/06/18 1121 12/06/18 1129  NA 138 140  K 3.8 3.8  CL 104 103  CO2 24  --   GLUCOSE 105* 99  BUN 16 14  CREATININE 0.78 0.70  CALCIUM 9.0  --    Liver Function Tests: Recent Labs  Lab 12/06/18 1121  AST 22  ALT 32  ALKPHOS 50  BILITOT 1.0  PROT 7.1  ALBUMIN 4.7   No results for input(s): LIPASE, AMYLASE in the last  168 hours. No results for input(s): AMMONIA in the last 168 hours. CBC: Recent Labs  Lab 12/06/18 1121 12/06/18 1129  WBC 5.1  --   NEUTROABS 2.9  --   HGB 13.3 13.6  HCT 40.0 40.0  MCV 95.0  --   PLT 186  --    Cardiac Enzymes: No results for input(s): CKTOTAL, CKMB, CKMBINDEX, TROPONINI in the last 168 hours. BNP: Invalid input(s): POCBNP CBG: Recent Labs  Lab 12/07/18 1226  GLUCAP 83   D-Dimer No results for input(s): DDIMER in the last 72 hours. Hgb A1c Recent Labs    12/07/18 0314  HGBA1C 5.4   Lipid Profile Recent Labs    12/07/18 0314  CHOL 173  HDL 85  LDLCALC 75  TRIG 63  CHOLHDL 2.0   Thyroid function studies No results for input(s): TSH, T4TOTAL, T3FREE, THYROIDAB in the last 72 hours.  Invalid input(s): FREET3 Anemia work up No results for input(s): VITAMINB12, FOLATE, FERRITIN, TIBC, IRON, RETICCTPCT in the last 72 hours. Urinalysis    Component Value Date/Time   COLORURINE STRAW (A) 12/06/2018 1345   APPEARANCEUR CLEAR 12/06/2018 1345   LABSPEC 1.010 12/06/2018 1345   PHURINE 8.0 12/06/2018 1345   GLUCOSEU NEGATIVE 12/06/2018 1345   HGBUR NEGATIVE 12/06/2018 1345   BILIRUBINUR NEGATIVE 12/06/2018 1345   KETONESUR NEGATIVE 12/06/2018 1345   PROTEINUR NEGATIVE 12/06/2018 1345   NITRITE NEGATIVE 12/06/2018 1345   LEUKOCYTESUR NEGATIVE 12/06/2018 1345   Sepsis Labs Invalid input(s): PROCALCITONIN,  WBC,  LACTICIDVEN Microbiology  Recent Results (from the past 240 hour(s))  SARS CORONAVIRUS 2 (TAT 6-24 HRS) Nasopharyngeal Nasopharyngeal Swab     Status: None   Collection Time: 12/06/18  3:21 PM   Specimen: Nasopharyngeal Swab  Result Value Ref Range Status   SARS Coronavirus 2 NEGATIVE NEGATIVE Final    Comment: (NOTE) SARS-CoV-2 target nucleic acids are NOT DETECTED. The SARS-CoV-2 RNA is generally detectable in upper and lower respiratory specimens during the acute phase of infection. Negative results do not preclude SARS-CoV-2  infection, do not rule out co-infections with other pathogens, and should not be used as the sole basis for treatment or other patient management decisions. Negative results must be combined with clinical observations, patient history, and epidemiological information. The expected result is Negative. Fact Sheet for Patients: HairSlick.nohttps://www.fda.gov/media/138098/download Fact Sheet for Healthcare Providers: quierodirigir.comhttps://www.fda.gov/media/138095/download This test is not yet approved or cleared by the [place] FDA and  has been authorized for detection and/or diagnosis of SARS-CoV-2 by FDA under an Emergency Use Authorization (EUA). This EUA will remain  in effect (meaning this test can be used) for the duration of the COVID-19 declaration under Section 56 4(b)(1) of the Act, 21 U.S.C. section 360bbb-3(b)(1), unless the authorization is terminated or revoked sooner. Performed at [facility], [address] [place], [place] [id number]      SIGNED:   Jacquelin Hawkingalph Tully Burgo, MD [facility] 12/07/2018, 4:25 PM

## 2018-12-07 NOTE — Discharge Instructions (Signed)
[name],  You were in the hospital with an initial concern for stroke but MRI revealed no stroke. You are being diagnosed with Bell Palsy and have been prescribed steroids and an antiviral. Please follow-up with your primary physician and neurologist.

## 2018-12-07 NOTE — Progress Notes (Signed)
Occupational Therapy Evaluation Patient Details Name: Jeff Perez MRN: [id number] DOB: Feb 28, 1961 Today's Date: 12/07/2018    History of Present Illness Jeff Perez is a 58 y.o. male with hyperlipidemia and hyperglycemia as past medical history.MRI brain done that showed a possible pontine infarct.   Clinical Impression   PTA, pt was living at home with his wife, and reports he was independent with ADL/IADL and functional mobility. 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 Pt currently reports he is at baseline and demonstrates ability to complete ADL/IADL at modified independent to independent level. Pt demonstrated normal balance during high level dynamic balancing activities such as braiding, side stepping, and reaching outside base of support. Pt demonstrates difficulty closing L eye lid and demonstrates L sided facial weakness. Pt is completing ADL and baseline and has no additional OT needs identified at this time. OT to sign off. Thank you for referral.    Follow Up Recommendations  No OT follow up    Equipment Recommendations  None recommended by OT    Recommendations for Other Services       Precautions / Restrictions Precautions Precautions: Fall Restrictions Weight Bearing Restrictions: No      Mobility Bed Mobility Overal bed mobility: Independent                Transfers Overall transfer level: Modified independent Equipment used: None             General transfer comment: increased time during mobility     Balance Overall balance assessment: Modified Independent                           High level balance activites: Side stepping;Braiding;Backward walking;Direction changes;Sudden stops High Level Balance Comments: no difficulties noted during mobility;pt completed high level balance activities with normal speed and no instability noted;pt picked item up off floor           ADL either  performed or assessed with clinical judgement   ADL Overall ADL's : Independent                                       General ADL Comments: pt demonstrated independence in all ADL completion      Vision Baseline Vision/History: Wears glasses Wears Glasses: At all times Patient Visual Report: No change from baseline Vision Assessment?: Yes;No apparent visual deficits Eye Alignment: Within Functional Limits Ocular Range of Motion: Within Functional Limits Alignment/Gaze Preference: Within Defined Limits Tracking/Visual Pursuits: Able to track stimulus in all quads without difficulty Convergence: Within functional limits Visual Fields: No apparent deficits Additional Comments: pt reports having difficulty closing L eyelid;noted increased time and effort to close eyelid and keep it shut     Perception     Praxis      Pertinent Vitals/Pain Pain Assessment: No/denies pain     Hand Dominance Right   Extremity/Trunk Assessment Upper Extremity Assessment Upper Extremity Assessment: Overall WFL for tasks assessed   Lower Extremity Assessment Lower Extremity Assessment: Overall WFL for tasks assessed   Cervical / Trunk Assessment Cervical / Trunk Assessment: Normal   Communication Communication Communication: No difficulties   Cognition Arousal/Alertness: Awake/alert Behavior During Therapy: Flat affect Overall Cognitive Status: Within Functional Limits for tasks assessed  General Comments  pt's wife present during session;pt reports he is at baseline    Exercises     Shoulder [place] expects to be discharged to:: Private residence Living Arrangements: Spouse/significant other Available Help at Discharge: Family;Available 24 hours/day Type of Home: House Home Access: Stairs to enter [employer] of Steps: 8 Entrance Stairs-Rails: Right;Left Home  Layout: One level     Bathroom Shower/Tub: Walk-in shower;Tub/shower unit   Bathroom Toilet: Standard     Home Equipment: None          Prior Functioning/Environment Level of Independence: Independent        Comments: was working, 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        OT Problem List: Decreased knowledge of precautions;Decreased safety awareness      OT Treatment/Interventions:      OT Goals(Current goals can be found in the care plan section) Acute Rehab OT Goals Patient Stated Goal: to go home today OT Goal Formulation: With patient Time For Goal Achievement: 12/21/18 Potential to Achieve Goals: Good  OT Frequency:     Barriers to D/C:            Co-evaluation              AM-PAC OT "6 Clicks" Daily Activity     Outcome Measure Help from another person eating meals?: None Help from another person taking care of personal grooming?: None Help from another person toileting, which includes using toliet, bedpan, or urinal?: None Help from another person bathing (including washing, rinsing, drying)?: None Help from another person to put on and taking off regular upper body clothing?: None Help from another person to put on and taking off regular lower body clothing?: None 6 Click Score: 24   End of Session Nurse Communication: Mobility status  Activity Tolerance: Patient tolerated treatment well Patient left: in bed;with call bell/phone within reach;with family/visitor present  OT Visit Diagnosis: Other abnormalities of gait and mobility (R26.89)                Time: 7867-6720 OT Time Calculation (min): 28 min Charges:  OT General Charges $OT Visit: 1 Visit OT Evaluation $OT Eval Low Complexity: 1 Low OT Treatments $Self Care/Home Management : 8-22 mins  Jeff Perez OTR/L Acute Rehabilitation Services Office: [phone]   Jeff Perez 12/07/2018, 1:33 PM

## 2018-12-07 NOTE — Progress Notes (Signed)
STROKE TEAM PROGRESS NOTE   INTERVAL HISTORY Pt lying in bed, wife at bedside. Pt still has left complete facial palsy. MRI brain repeat showed no stroke. Will put him on steroids and valacyclovir.   Vitals:   12/07/18 0200 12/07/18 0312 12/07/18 0400 12/07/18 0810  BP: 138/83 131/86  (!) 127/92  Pulse: (!) 51 62  (!) 56  Resp: Temp:  97.8 F (36.6 C)  98.6 F (37 C)  TempSrc:  Oral  Oral  SpO2: 97% 99%  97%  Weight:      Height:        CBC:  Recent Labs  Lab 12/06/18 1121 12/06/18 1129  WBC 5.1  --   NEUTROABS 2.9  --   HGB 13.3 13.6  HCT 40.0 40.0  MCV 95.0  --   PLT 186  --     Basic Metabolic Panel:  Recent Labs  Lab 12/06/18 1121 12/06/18 1129  NA 138 140  K 3.8 3.8  CL 104 103  CO2 24  --   GLUCOSE 105* 99  BUN 16 14  CREATININE 0.78 0.70  CALCIUM 9.0  --    Lipid Panel:     Component Value Date/Time   CHOL 173 12/07/2018 0314   TRIG 63 12/07/2018 0314   HDL 85 12/07/2018 0314   CHOLHDL 2.0 12/07/2018 0314   VLDL 13 12/07/2018 0314   LDLCALC 75 12/07/2018 0314   HgbA1c:  Lab Results  Component Value Date   HGBA1C 5.4 12/07/2018   Urine Drug Screen:     Component Value Date/Time   LABOPIA NONE DETECTED 12/06/2018 1346   COCAINSCRNUR NONE DETECTED 12/06/2018 1346   LABBENZ NONE DETECTED 12/06/2018 1346   AMPHETMU NONE DETECTED 12/06/2018 1346   THCU NONE DETECTED 12/06/2018 1346   LABBARB NONE DETECTED 12/06/2018 1346    Alcohol Level No results found for: ETH  IMAGING Ct Angio Head W Or Wo Contrast  Result Date: 12/06/2018 CLINICAL DATA:  Left facial droop. Right upper extremity weakness. Possible infarct right pons on MRI today. EXAM: CT ANGIOGRAPHY HEAD AND NECK TECHNIQUE: Multidetector CT imaging of the head and neck was performed using the standard protocol during bolus administration of intravenous contrast. Multiplanar CT image reconstructions and MIPs were obtained to evaluate the vascular anatomy. Carotid stenosis  measurements (when applicable) are obtained utilizing NASCET criteria, using the distal internal carotid diameter as the denominator. CONTRAST:  80mL OMNIPAQUE IOHEXOL 350 MG/ML SOLN COMPARISON:  MRI head 12/06/2018 FINDINGS: CT HEAD FINDINGS Brain: No evidence of acute infarction, hemorrhage, hydrocephalus, extra-axial collection or mass lesion/mass effect. Vascular: Negative for hyperdense vessel Skull: Negative Sinuses: Negative Orbits: Negative Review of the MIP images confirms the above findings CTA NECK FINDINGS Aortic arch: Standard branching. Imaged portion shows no evidence of aneurysm or dissection. No significant stenosis of the major arch vessel origins. Right carotid system: Normal right carotid. No atherosclerotic disease dissection or stenosis Left carotid system: Normal left carotid. No atherosclerotic disease, stenosis, or dissection Vertebral arteries: Right vertebral artery dominant and widely patent. Small left vertebral artery patent without significant stenosis. Skeleton: Mild disc degeneration and spurring C6-7. Periapical lucency around left lower molar. Other neck: Negative for mass or adenopathy in the neck. Upper chest: Negative Review of the MIP images confirms the above findings CTA HEAD FINDINGS Anterior circulation: Internal carotid artery widely patent bilaterally. 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 Anterior and middle cerebral arteries widely patent without significant stenosis. Posterior  circulation: Right vertebral artery dominant. Small left vertebral artery patent to the basilar. PICA patent bilaterally. Small proximal basilar due to persistent trigeminal artery on the left. Superior cerebellar and posterior cerebral arteries patent bilaterally without stenosis. Venous sinuses: Normal venous enhancement. Anatomic variants: None Review of the MIP images confirms the above findings IMPRESSION: 1. No significant carotid or vertebral artery stenosis in  the neck 2. No significant intracranial large vessel occlusion or significant stenosis. 3. Persistent trigeminal artery on the left, a congenital variation. Electronically Signed   By: Marlan Palauharles  Clark M.D.   On: 12/06/2018 20:43   Ct Head Wo Contrast  Result Date: 12/06/2018 CLINICAL DATA:  Patient woke up this morning with numbness in right face. EXAM: CT HEAD WITHOUT CONTRAST TECHNIQUE: Contiguous axial images were obtained from the base of the skull through the vertex without intravenous contrast. COMPARISON:  None. FINDINGS: Brain: No evidence of acute infarction, hemorrhage, hydrocephalus, extra-axial collection or mass lesion/mass effect. Vascular: No hyperdense vessel or unexpected calcification. Skull: Normal. Negative for fracture or focal lesion. Sinuses/Orbits: No acute finding. Other: None. IMPRESSION: No acute abnormalities are identified to explain the patient's symptoms. Electronically Signed   By: Gerome Samavid  Williams III M.D   On: 12/06/2018 11:55   Ct Angio Neck W Or Wo Contrast  Result Date: 12/06/2018 CLINICAL DATA:  Left facial droop. Right upper extremity weakness. Possible infarct right pons on MRI today. EXAM: CT ANGIOGRAPHY HEAD AND NECK TECHNIQUE: Multidetector CT imaging of the head and neck was performed using the standard protocol during bolus administration of intravenous contrast. Multiplanar CT image reconstructions and MIPs were obtained to evaluate the vascular anatomy. Carotid stenosis measurements (when applicable) are obtained utilizing NASCET criteria, using the distal internal carotid diameter as the denominator. CONTRAST:  80mL OMNIPAQUE IOHEXOL 350 MG/ML SOLN COMPARISON:  MRI head 12/06/2018 FINDINGS: CT HEAD FINDINGS Brain: No evidence of acute infarction, hemorrhage, hydrocephalus, extra-axial collection or mass lesion/mass effect. Vascular: Negative for hyperdense vessel Skull: Negative Sinuses: Negative Orbits: Negative Review of the MIP images confirms the above  findings CTA NECK FINDINGS Aortic arch: Standard branching. Imaged portion shows no evidence of aneurysm or dissection. No significant stenosis of the major arch vessel origins. Right carotid system: Normal right carotid. No atherosclerotic disease dissection or stenosis Left carotid system: Normal left carotid. No atherosclerotic disease, stenosis, or dissection Vertebral arteries: Right vertebral artery dominant and widely patent. Small left vertebral artery patent without significant stenosis. Skeleton: Mild disc degeneration and spurring C6-7. Periapical lucency around left lower molar. Other neck: Negative for mass or adenopathy in the neck. Upper chest: Negative Review of the MIP images confirms the above findings CTA HEAD FINDINGS Anterior circulation: Internal carotid artery widely patent bilaterally. 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 Anterior and middle cerebral arteries widely patent without significant stenosis. Posterior circulation: Right vertebral artery dominant. Small left vertebral artery patent to the basilar. PICA patent bilaterally. Small proximal basilar due to persistent trigeminal artery on the left. Superior cerebellar and posterior cerebral arteries patent bilaterally without stenosis. Venous sinuses: Normal venous enhancement. Anatomic variants: None Review of the MIP images confirms the above findings IMPRESSION: 1. No significant carotid or vertebral artery stenosis in the neck 2. No significant intracranial large vessel occlusion or significant stenosis. 3. Persistent trigeminal artery on the left, a congenital variation. Electronically Signed   By: Marlan Palauharles  Clark M.D.   On: 12/06/2018 20:43   Mr Brain Wo Contrast  Result Date: 12/07/2018 CLINICAL DATA:  Rule out  stroke.  Left lip symptoms. EXAM: MRI HEAD WITHOUT CONTRAST TECHNIQUE: Multiplanar, multiecho pulse sequences of the brain and surrounding structures were obtained without intravenous contrast.  COMPARISON:  MRI head tense 07/2018 FINDINGS: Limited in focus study of the brain to rule out acute infarct in the pons as questioned on the MRI from 12/06/2018. Thin-section coronal and axial diffusion weighted imaging performed as well as axial FLAIR and T2. Negative for acute infarct in the pons as questioned yesterday. No acute infarct elsewhere in the brain. Ventricle size normal.  Normal white matter. Normal arterial flow voids. Paranasal sinuses and orbit negative. IMPRESSION: Negative for acute infarct with particular attention to the brainstem. Electronically Signed   By: Franchot Gallo M.D.   On: 12/07/2018 14:05   Mr Brain Wo Contrast  Result Date: 12/06/2018 CLINICAL DATA:  Right facial numbness. EXAM: MRI HEAD WITHOUT CONTRAST TECHNIQUE: Multiplanar, multiecho pulse sequences of the brain and surrounding structures were obtained without intravenous contrast. COMPARISON:  Head CT 12/06/2018 FINDINGS: Brain: There is a subcentimeter curvilinear focus of trace diffusion signal hyperintensity in the right paramedian pons on the axial diffusion series (series 4, images 12 and 13) with suggestion of reduced ADC, however this is an area prone to artifact and an infarct is not clearly confirmed on the coronal diffusion sequence. There is no corresponding sold abnormality in this location on conventional sequences. The brain is otherwise normal in signal. The ventricles and sulci are normal. Vascular: Major intracranial vascular flow voids are preserved. Skull and upper cervical spine: Unremarkable bone marrow signal. Sinuses/Orbits: Unremarkable orbits. Paranasal sinuses and mastoid air cells are clear. Other: None. IMPRESSION: 1. Possible subcentimeter acute right paramedian pontine infarct. 2. Otherwise unremarkable appearance of the brain. Electronically Signed   By: Logan Bores M.D.   On: 12/06/2018 12:46    PHYSICAL EXAM  Temp:  [97.8 F (36.6 C)-98.8 F (37.1 C)] 98.8 F (37.1 C) (10/06  1229) Pulse Rate:  [49-73] 63 (10/06 1229) Resp:  [14-24] 17 (10/06 1229) BP: (123-148)/(83-97) 131/97 (10/06 1229) SpO2:  [94 %-99 %] 97 % (10/06 1229)  General - Well nourished, well developed, in no apparent distress.  Ophthalmologic - fundi not visualized due to noncooperation.  Cardiovascular - Regular rhythm and rate.  Mental Status -  Level of arousal and orientation to time, place, and person were intact. Language including expression, naming, repetition, comprehension was assessed and found intact. Fund of Knowledge was assessed and was intact.  Cranial Nerves II - XII - II - Visual field intact OU. III, IV, VI - Extraocular movements intact. V - Facial sensation intact bilaterally. VII - left upper and lower facial palsy. VIII - Hearing & vestibular intact bilaterally. X - Palate elevates symmetrically. XI - Chin turning & shoulder shrug intact bilaterally. XII - Tongue protrusion intact.  Motor Strength - The patient's strength was normal in all extremities and pronator drift was absent.  Bulk was normal and fasciculations were absent.   Motor Tone - Muscle tone was assessed at the neck and appendages and was normal.  Reflexes - The patient's reflexes were symmetrical in all extremities and he had no pathological reflexes.  Sensory - Light touch, temperature/pinprick were assessed and were symmetrical.    Coordination - The patient had normal movements in the hands and feet with no ataxia or dysmetria.  Tremor was absent.  Gait and Station - deferred.   ASSESSMENT/PLAN Mr. Jeff Perez is a 58 y.o. male with history of HTN and HLD presenting  with L facial numbness and weakness and R hand and leg numbness.   Left Bell's palsy  CT head No acute abnormality.  MRI  ?? tiny R paramedian pontine infarct   Repeat MRI with thin slice through brainstem - no acute infarct  CTA head & neck no ELVO, no significant stenosis   Repeat MRI no acute abnormality   2D  Echo EF 60-65%. No source of embolus   LDL 75  HgbA1c 5.4  Lovenox 40 mg sq daily for VTE prophylaxis  No antithrombotic prior to admission, now on prednisone and valacyclovir for 7 days course.   Also put on artificial tears for left corneal protection  Therapy recommendations:  No therapy needs  Disposition:  Return home  Follow up with [facility]  Hypertension  Stable . Long-term BP goal normotensive  Hyperlipidemia  Home meds:  lipitor 40, resumed in hospital  LDL 75, goal < 70  Continue statin at discharge  Other Stroke Risk Factors  ETOH use, advised to drink no more than 2 drink(s) a day  Obesity, Body mass index is 29.85 kg/m., recommend weight loss, diet and exercise as appropriate   Family hx stroke (mother)  Hospital day # 1  Neurology will sign off. Please call with questions. Pt will follow up with neurology at [facility] in about 4 weeks. Thanks for the consult.  Marvel Plan, MD PhD Stroke Neurology 12/07/2018 5:07 PM   To contact Stroke Continuity provider, please refer to [email or web address]. After hours, contact General Neurology

## 2018-12-07 NOTE — Progress Notes (Signed)
  Echocardiogram 2D Echocardiogram has been performed.  Jury Caserta L Androw 12/07/2018, 8:23 AM

## 2018-12-07 NOTE — Progress Notes (Signed)
PT Cancellation Note and Discharge  Patient Details Name: Jeff Perez MRN: [id number] DOB: 03/24/1960   Cancelled Treatment:    Reason Eval/Treat Not Completed: PT screened, no needs identified, will sign off. Discussed pt case with OT who reports pt is currently functioning at a modified independent to independent level without an AD. OT took pt through several higher level balance activities that pt reportedly completed without difficulty or evidence of instability. Will sign off at this time. If needs change, please reconsult.   Thelma Comp 12/07/2018, 1:59 PM   Rolinda Roan, PT, DPT Acute Rehabilitation Services Pager: [phone] Office: [phone]

## 2018-12-07 NOTE — Progress Notes (Signed)
PT Cancellation Note  Patient Details Name: Jeff Perez MRN: [id number] DOB: May 23, 1960   Cancelled Treatment:    Reason Eval/Treat Not Completed: Patient at procedure or test/unavailable. Pt currently off unit for MRI. Will check back as schedule allows to initiate PT evaluation.    Thelma Comp 12/07/2018, 11:12 AM   Rolinda Roan, PT, DPT Acute Rehabilitation Services Pager: [phone] Office: [phone]

## 2019-01-18 ENCOUNTER — Other Ambulatory Visit: Payer: Self-pay

## 2019-01-18 ENCOUNTER — Encounter: Payer: Self-pay | Admitting: Neurology

## 2019-01-18 ENCOUNTER — Ambulatory Visit (INDEPENDENT_AMBULATORY_CARE_PROVIDER_SITE_OTHER): Payer: Managed Care, Other (non HMO) | Admitting: Neurology

## 2019-01-18 VITALS — BP 132/88 | HR 71 | Temp 96.9°F | Ht 71.0 in | Wt 215.5 lb

## 2019-01-18 DIAGNOSIS — G510 Bell's palsy: Secondary | ICD-10-CM | POA: Diagnosis not present

## 2019-01-18 NOTE — Progress Notes (Signed)
PATIENT: Jeff Perez DOB: [birth year]  Chief Complaint  Patient presents with  . Bell's Palsy    Reports still having difficulty blinking left eye and controlling left side of lip when shaving.   He also has issues with his left eye being too dry or too moist.  These changes affect his vision.   Marland Kitchen PCP    Berle Mull, MD - referred from hospital     HISTORICAL  Jeff Perez is a 58 year old male, seen in request by his primary care physician Dr. Berle Mull for evaluation of left upper and lower face weakness, initial evaluation was on January 18, 2019.  I have reviewed and summarized the referring note from the referring physician.  He had a past medical history of hyperlipidemia, on December 06, 2018, shortly after he woke up in the morning, he noticed mild left lip heaviness, later he also noticed difficulty closing his left eye, he denies left arm or leg weakness, denies sensory loss, no visual loss, he did have a headache at the left mastoid, severe, pressure. He was treated at emergency room the same day, I personally reviewed MRI of the brain that was normal  Labs, LDL 75, A1c 5.4, HIV, UDS negative,  CMP, glucose 105, creat 0.78,  He was treated with acyclovir and steroid for 1 week, his left face weakness has much improved, even at its worst, he could still close his left eye,  REVIEW OF SYSTEMS: Full 14 system review of systems performed and notable only for as above All other review of systems were negative.  ALLERGIES: No Known Allergies  HOME MEDICATIONS: Current Outpatient Medications  Medication Sig Dispense Refill  . artificial tears (LACRILUBE) OINT ophthalmic ointment Place into the left eye at bedtime as needed for dry eyes. 1 Tube 1  . atorvastatin (LIPITOR) 40 MG tablet Take 40 mg by mouth daily.    . hydroxypropyl methylcellulose / hypromellose (ISOPTO TEARS / GONIOVISC) 2.5 % ophthalmic solution Place 1 drop into the left eye 4 (four) times daily as  needed for dry eyes. 15 mL 12   No current facility-administered medications for this visit.     PAST MEDICAL HISTORY: Past Medical History:  Diagnosis Date  . Allergy   . Bell's palsy   . Hyperglycemia   . Hyperlipidemia     PAST SURGICAL HISTORY: Past Surgical History:  Procedure Laterality Date  . VASECTOMY      FAMILY HISTORY: Family History  Problem Relation Age of Onset  . Diabetes Mother   . Stroke Mother   . Prostate cancer Father   . Cancer Maternal Grandfather   . Diabetes Sister     SOCIAL HISTORY: Social History   Socioeconomic History  . Marital status: Married    Spouse name: Not on file  . Number of children: 2  . Years of education: college  . Highest education level: Not on file  Occupational History  . Occupation: owns Haematologist  Social Needs  . Financial resource strain: Not on file  . Food insecurity    Worry: Not on file    Inability: Not on file  . Transportation needs    Medical: Not on file    Non-medical: Not on file  Tobacco Use  . Smoking status: Never Smoker  . Smokeless tobacco: Never Used  Substance and Sexual Activity  . Alcohol use: Yes    Comment: social  . Drug use: Not Currently  . Sexual activity: Not on file  Lifestyle  . Physical activity    Days per week: Not on file    Minutes per session: Not on file  . Stress: Not on file  Relationships  . Social Musician on phone: Not on file    Gets together: Not on file    Attends religious service: Not on file    Active member of club or organization: Not on file    Attends meetings of clubs or organizations: Not on file    Relationship status: Not on file  . Intimate partner violence    Fear of current or ex partner: Not on file    Emotionally abused: Not on file    Physically abused: Not on file    Forced sexual activity: Not on file  Other Topics Concern  . Not on file  Social History Narrative   Lives at home with his wife.    Right-handed.   No caffeine use.     PHYSICAL EXAM   Vitals:   01/18/19 0803  BP: 132/88  Pulse: 71  Temp: (!) 96.9 F (36.1 C)  Weight: 215 lb 8 oz (97.8 kg)  Height: 5\' 11"  (1.803 m)    Not recorded      Body mass index is 30.06 kg/m.  PHYSICAL EXAMNIATION:  Gen: NAD, conversant, well nourised, well groomed                     Cardiovascular: Regular rate rhythm, no peripheral edema, warm, nontender. Eyes: Conjunctivae clear without exudates or hemorrhage Neck: Supple, no carotid bruits. Pulmonary: Clear to auscultation bilaterally   NEUROLOGICAL EXAM:  MENTAL STATUS: Speech:    Speech is normal; fluent and spontaneous with normal comprehension.  Cognition:     Orientation to time, place and person     Normal recent and remote memory     Normal Attention span and concentration     Normal Language, naming, repeating,spontaneous speech     Fund of knowledge   CRANIAL NERVES: CN II: Visual fields are full to confrontation.  Pupils are round equal and briskly reactive to light. CN III, IV, VI: extraocular movement are normal. No ptosis. CN V: Facial sensation is intact to pinprick in all 3 divisions bilaterally. Corneal responses are intact.  CN VII: He has mild left eye closure weakness, mild left frontalis, cheek muscle weakness. CN VIII: Hearing is normal to causal conversation. CN IX, X: Palate elevates symmetrically. Phonation is normal. CN XI: Head turning and shoulder shrug are intact CN XII: Tongue is midline with normal movements and no atrophy.  MOTOR: There is no pronator drift of out-stretched arms. Muscle bulk and tone are normal. Muscle strength is normal.  REFLEXES: Reflexes are 2+ and symmetric at the biceps, triceps, knees, and ankles. Plantar responses are flexor.  SENSORY: Intact to light touch, pinprick, positional sensation and vibratory sensation are intact in fingers and toes.  COORDINATION: Rapid alternating movements and fine  finger movements are intact. There is no dysmetria on finger-to-nose and heel-knee-shin.    GAIT/STANCE: Posture is normal. Gait is steady with normal steps, base, arm swing, and turning.    DIAGNOSTIC DATA (LABS, IMAGING, TESTING) - I reviewed patient records, labs, notes, testing and imaging myself where available.   ASSESSMENT AND PLAN  Jeff Perez is a 58 y.o. male   Left Bell's palsy  Normal MRI of the brain  Was treated with acyclovir and steroid since December 06, 2018  Likely he will  continue to slowly recover  Only return to clinic for new issues   Levert FeinsteinYijun Zamoria Boss, M.D. Ph.D.  [facility] [address], [address] [place], [place] [address] Ph: [phone] Fax: [phone]  CC: Pati GalloKramer, James, MD

## 2019-02-24 ENCOUNTER — Other Ambulatory Visit: Payer: Managed Care, Other (non HMO)

## 2019-02-24 ENCOUNTER — Ambulatory Visit: Payer: Managed Care, Other (non HMO) | Attending: Internal Medicine

## 2019-02-24 DIAGNOSIS — Z20822 Contact with and (suspected) exposure to covid-19: Secondary | ICD-10-CM

## 2019-02-25 LAB — NOVEL CORONAVIRUS, NAA: SARS-CoV-2, NAA: NOT DETECTED

## 2019-04-29 ENCOUNTER — Telehealth: Payer: Self-pay | Admitting: Neurology

## 2019-04-29 NOTE — Telephone Encounter (Signed)
Patient called requesting a CB from RN in regards to his left sided bells palsy states he is having a relapse.

## 2019-05-02 NOTE — Telephone Encounter (Signed)
I spoke to his wife who stated this appt would be okay. She will only have him call back, if the time does not work well for him.

## 2019-05-02 NOTE — Telephone Encounter (Signed)
He was last seen on 01/18/2019. Since he his having worsening symptoms, he needs to be re-evaluated. Margie Ege, NP has an opening on 05/03/19 at 7:45am (check-in 7:15am).   I left a message letting him know that I have held this appt for him until noon. Requesting he call back to confirm it. If it is not convenient, she has other times this week.

## 2019-05-03 ENCOUNTER — Ambulatory Visit (INDEPENDENT_AMBULATORY_CARE_PROVIDER_SITE_OTHER): Payer: Managed Care, Other (non HMO) | Admitting: Neurology

## 2019-05-03 ENCOUNTER — Other Ambulatory Visit: Payer: Self-pay

## 2019-05-03 ENCOUNTER — Encounter: Payer: Self-pay | Admitting: Neurology

## 2019-05-03 VITALS — BP 119/74 | HR 71 | Temp 97.0°F | Ht 71.0 in | Wt 219.8 lb

## 2019-05-03 DIAGNOSIS — G510 Bell's palsy: Secondary | ICD-10-CM

## 2019-05-03 NOTE — Progress Notes (Signed)
PATIENT: [name] DOB: [birth year]  REASON FOR VISIT: follow up HISTORY FROM: patient  HISTORY OF PRESENT ILLNESS: Today 05/03/19  HISTORY  Marvelle Caudill is a 59 year old male, seen in request by his primary care physician Dr. Pati Gallo for evaluation of left upper and lower face weakness, initial evaluation was on January 18, 2019.  I have reviewed and summarized the referring note from the referring physician.  He had a past medical history of hyperlipidemia, on December 06, 2018, shortly after he woke up in the morning, he noticed mild left lip heaviness, later he also noticed difficulty closing his left eye, he denies left arm or leg weakness, denies sensory loss, no visual loss, he did have a headache at the left mastoid, severe, pressure. He was treated at emergency room the same day, I personally reviewed MRI of the brain that was normal  Labs, LDL 75, A1c 5.4, HIV, UDS negative,  CMP, glucose 105, creat 0.78,  He was treated with acyclovir and steroid for 1 week, his left face weakness has much improved, even at its worst, he could still close his left eye,  Update May 03, 2019 SS: Here today reporting for the last 2 to 3 weeks, has noted some twitching to his left nostril, happening about once every hour, over the weekend, noticed twitching to the left eye, twice twitching to the right eye. Feels mild heaviness to left upper lip, when closing eyes, left eyelid feels heavier. He reports he essentially completely recovered, when seen last November was 80 to 90% back to normal. He denies any headache, weakness to his extremities. Left cheek is hypersensitive, feels bruised. Has scar to left upper lip from prior injury, causing some asymmetry. He walked this morning several miles. I saw the patient with Dr. Terrace Arabia  REVIEW OF SYSTEMS: Out of a complete 14 system review of symptoms, the patient complains only of the following symptoms, and all other reviewed systems are  negative.  N/A  ALLERGIES: No Known Allergies  HOME MEDICATIONS: Outpatient Medications Prior to Visit  Medication Sig Dispense Refill  . artificial tears (LACRILUBE) OINT ophthalmic ointment Place into the left eye at bedtime as needed for dry eyes. 1 Tube 1  . atorvastatin (LIPITOR) 40 MG tablet Take 40 mg by mouth daily.    . hydroxypropyl methylcellulose / hypromellose (ISOPTO TEARS / GONIOVISC) 2.5 % ophthalmic solution Place 1 drop into the left eye 4 (four) times daily as needed for dry eyes. 15 mL 12   No facility-administered medications prior to visit.    PAST MEDICAL HISTORY: Past Medical History:  Diagnosis Date  . Allergy   . Bell's palsy   . Hyperglycemia   . Hyperlipidemia     PAST SURGICAL HISTORY: Past Surgical History:  Procedure Laterality Date  . VASECTOMY      FAMILY HISTORY: Family History  Problem Relation Age of Onset  . Diabetes Mother   . Stroke Mother   . Prostate cancer Father   . Cancer Maternal Grandfather   . Diabetes Sister     SOCIAL HISTORY: Social History   Socioeconomic History  . Marital status: Married    Spouse name: Not on file  . Number of children: 2  . Years of education: college  . Highest education level: Not on file  Occupational History  . 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 Smoking status: Never Smoker  . Smokeless tobacco: Never Used  Substance and Sexual Activity  . Alcohol  use: Yes    Comment: social  . Drug use: Not Currently  . Sexual activity: Not on file  Other Topics Concern  . Not on file  Social History Narrative   Lives at home with his wife.   Right-handed.   No caffeine use.   Social Determinants of Health   Financial Resource Strain:   . Difficulty of Paying Living Expenses: Not on file  Food Insecurity:   . Worried About Charity fundraiser in the Last Year: Not on file  . Ran Out of Food in the Last Year: Not on file  Transportation Needs:   . Lack of  Transportation (Medical): Not on file  . Lack of Transportation (Non-Medical): Not on file  Physical Activity:   . Days of Exercise per Week: Not on file  . Minutes of Exercise per Session: Not on file  Stress:   . Feeling of Stress : Not on file  Social Connections:   . Frequency of Communication with Friends and Family: Not on file  . Frequency of Social Gatherings with Friends and Family: Not on file  . Attends Religious Services: Not on file  . Active Member of Clubs or Organizations: Not on file  . Attends Archivist Meetings: Not on file  . Marital Status: Not on file  Intimate Partner Violence:   . Fear of Current or Ex-Partner: Not on file  . Emotionally Abused: Not on file  . Physically Abused: Not on file  . Sexually Abused: Not on file   PHYSICAL EXAM  Vitals:   05/03/19 0721  BP: 119/74  Pulse: 71  Temp: (!) 97 F (36.1 C)  TempSrc: Oral  Weight: 219 lb 12.8 oz (99.7 kg)  Height: 5\' 11"  (1.803 m)   Body mass index is 30.66 kg/m.  Generalized: Well developed, in no acute distress   Neurological examination  Mentation: Alert oriented to time, place, history taking. Follows all commands speech and language fluent Cranial nerve II-XII: Pupils were equal round reactive to light. Extraocular movements were full, visual field were full on confrontational test. Mild left cheek puff weakness, mild left frontalis asymmetry, mild left eye closure weakness, mild asymmetry to left upper lip from old scar. Head turning and shoulder shrug were normal and symmetric. Motor: The motor testing reveals 5 over 5 strength of all 4 extremities. Good symmetric motor tone is noted throughout.  Sensory: Hypersensitive to soft touch left V2 region Coordination: Cerebellar testing reveals good finger-nose-finger and heel-to-shin bilaterally.  Gait and station: Gait is normal. Tandem gait is normal. Romberg is negative. No drift is seen.  Reflexes: Deep tendon reflexes are  symmetric and normal bilaterally.   DIAGNOSTIC DATA (LABS, IMAGING, TESTING) - I reviewed patient records, labs, notes, testing and imaging myself where available.  Lab Results  Component Value Date   WBC 5.1 12/06/2018   HGB 13.6 12/06/2018   HCT 40.0 12/06/2018   MCV 95.0 12/06/2018   PLT 186 12/06/2018      Component Value Date/Time   NA 140 12/06/2018 1129   K 3.8 12/06/2018 1129   CL 103 12/06/2018 1129   CO2 24 12/06/2018 1121   GLUCOSE 99 12/06/2018 1129   BUN 14 12/06/2018 1129   CREATININE 0.70 12/06/2018 1129   CALCIUM 9.0 12/06/2018 1121   PROT 7.1 12/06/2018 1121   ALBUMIN 4.7 12/06/2018 1121   AST 22 12/06/2018 1121   ALT 32 12/06/2018 1121   ALKPHOS 50 12/06/2018 1121   BILITOT  1.0 12/06/2018 1121   GFRNONAA >60 12/06/2018 1121   GFRAA >60 12/06/2018 1121   Lab Results  Component Value Date   CHOL 173 12/07/2018   HDL 85 12/07/2018   LDLCALC 75 12/07/2018   TRIG 63 12/07/2018   CHOLHDL 2.0 12/07/2018   Lab Results  Component Value Date   HGBA1C 5.4 12/07/2018   No results found for: VITAMINB12 No results found for: TSH  ASSESSMENT AND PLAN 59 y.o. year old male  has a past medical history of Allergy, Bell's palsy, Hyperglycemia, and Hyperlipidemia. here with:  1.  Left-sided Bell's palsy, initial in October 2020 -MRI of the brain was normal 12/07/2018  -Treated with acyclovir and steroid December 06, 2018  -Reports return of symptoms 2-3 weeks ago, twitching to left nostril, mouth, heaviness to left upper lip, feeling delay of left eye lid closing  -On exam, mild left cheek puff weakness, hypersensitivity to touch to V3, mild left frontalis asymmetry, chronic asymmetry to left upper lip from old scar  -Dr. 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 50% of this time was spent discussing his plan of care.    Margie Ege, AGNP-C, DNP 05/03/2019, 8:12 AM [facility] [address], [address] [place], [place] [address] [phone]

## 2019-05-04 NOTE — Progress Notes (Addendum)
I have reviewed and agreed above plan. He has left upper lip scar due to previous injury

## 2019-07-27 ENCOUNTER — Ambulatory Visit: Payer: Managed Care, Other (non HMO) | Attending: Internal Medicine

## 2019-07-27 DIAGNOSIS — Z23 Encounter for immunization: Secondary | ICD-10-CM

## 2019-07-27 NOTE — Progress Notes (Signed)
   Covid-19 Vaccination Clinic  Name:  Jeff Perez    MRN: [id number] DOB: Aug 01, 1960  07/27/2019  [name] was observed post Covid-19 immunization for 15 minutes without incident. He was provided with Vaccine Information Sheet and instruction to access the V-Safe system.   [name] was instructed to call 911 with any severe reactions post vaccine: Marland Kitchen Difficulty breathing  . Swelling of face and throat  . A fast heartbeat  . A bad rash all over body  . Dizziness and weakness   Immunizations Administered    Name Date Dose VIS Date Route   JANSSEN COVID-19 VACCINE 07/27/2019  8:29 AM 0.5 mL 04/30/2019 Intramuscular   Manufacturer: [employer]   Lot: [id number]   NDC: 92924-462-86

## 2019-09-19 ENCOUNTER — Encounter: Payer: Self-pay | Admitting: Orthopedic Surgery

## 2019-09-19 ENCOUNTER — Ambulatory Visit (INDEPENDENT_AMBULATORY_CARE_PROVIDER_SITE_OTHER): Payer: Managed Care, Other (non HMO) | Admitting: Orthopedic Surgery

## 2019-09-19 ENCOUNTER — Ambulatory Visit (INDEPENDENT_AMBULATORY_CARE_PROVIDER_SITE_OTHER): Payer: Managed Care, Other (non HMO)

## 2019-09-19 ENCOUNTER — Other Ambulatory Visit: Payer: Self-pay

## 2019-09-19 VITALS — Ht 71.0 in | Wt 215.0 lb

## 2019-09-19 DIAGNOSIS — M545 Low back pain, unspecified: Secondary | ICD-10-CM

## 2019-09-19 DIAGNOSIS — M25552 Pain in left hip: Secondary | ICD-10-CM

## 2019-09-19 DIAGNOSIS — G8929 Other chronic pain: Secondary | ICD-10-CM

## 2019-09-19 MED ORDER — PREDNISONE 10 MG PO TABS: 20.0000 mg | ORAL_TABLET | Freq: Every day | ORAL | 0 refills | Status: DC

## 2019-09-19 NOTE — Progress Notes (Signed)
Office Visit Note   Patient: Jeff Perez           Date of Birth: 21-Dec-1960           MRN: [id number] Visit Date: 09/19/2019              Requested by: No referring provider defined for this encounter. PCP: System, Pcp Not In  Chief Complaint  Patient presents with  . Lower Back - Pain  . Left Hip - Pain  . Left Elbow - Pain      HPI: Patient is a 59 year old gentleman who presents with 1 year history of lower back and left hip pain.  He states it is a persistent dull pain last for hours and then goes away patient states that walking early in the morning it is painful after going up a hill or 2 and the pain goes away and then comes back later in the day.  Patient's pain is painful in different places around the hip.  He states that stretching does help he states occasionally the left toes will go numb.  Patient also has had a cough injury over the medial epicondyles right arm.  Patient states he has had a medial epicondyle injection of the right elbow which did provide about 3 months of relief.  Assessment & Plan: Visit Diagnoses:  1. Pain in left hip   2. Chronic low back pain, unspecified back pain laterality, unspecified whether sciatica present     Plan: We will start occasional low-dose prednisone 20 mg with breakfast and wean off as he shows improvement.  We will set him up for an MRI scan of his lumbar spine since he has had symptoms for over a year and does have facet arthropathy at L4-5 on the left consistent with normal distribution of his pain which is in left hip and his left toes.  Discussed that he may benefit from an epidural steroid injection follow-up after the MRI is obtained.  Follow-Up Instructions: Return in about 4 weeks (around 10/17/2019).   Ortho Exam  Patient is alert, oriented, no adenopathy, well-dressed, normal affect, normal respiratory effort. Examination patient has a normal gait he has equal range of motion of both hips without pain with hip  range of motion.  He has a negative straight leg raise on the left and no focal motor weakness.  Imaging: XR HIP UNILAT W OR W/O PELVIS 2-3 VIEWS LEFT  Result Date: 09/19/2019 2 view radiographs of the left hip shows a congruent joint symmetric with the right without periarticular cystic changes or bony spurs.  XR Lumbar Spine 2-3 Views  Result Date: 09/19/2019 2 view radiographs of the lumbar spine shows facet arthropathy on the left at L4- 5.  No images are attached to the encounter.  Labs: Lab Results  Component Value Date   HGBA1C 5.4 12/07/2018     Lab Results  Component Value Date   ALBUMIN 4.7 12/06/2018    No results found for: MG No results found for: VD25OH  No results found for: PREALBUMIN CBC EXTENDED Latest Ref Rng & Units 12/06/2018 12/06/2018  WBC 4.0 - 10.5 K/uL - 5.1  RBC 4.22 - 5.81 MIL/uL - 4.21(L)  HGB 13.0 - 17.0 g/dL 78.6 76.7  HCT 39 - 52 % 40.0 40.0  PLT 150 - 400 K/uL - 186  NEUTROABS 1.7 - 7.7 K/uL - 2.9  LYMPHSABS 0.7 - 4.0 K/uL - 1.6     Body mass index is 29.99 kg/m.  Orders:  Orders Placed This Encounter  Procedures  . XR HIP UNILAT W OR W/O PELVIS 2-3 VIEWS LEFT  . XR Lumbar Spine 2-3 Views  . MR Lumbar Spine w/o contrast   Meds ordered this encounter  Medications  . predniSONE (DELTASONE) 10 MG tablet    Sig: Take 2 tablets (20 mg total) by mouth daily with breakfast.    Dispense:  60 tablet    Refill:  0     Procedures: No procedures performed  Clinical Data: No additional findings.  ROS:  All other systems negative, except as noted in the HPI. Review of Systems  Objective: Vital Signs: Ht 5\' 11"  (1.803 m)   Wt 215 lb (97.5 kg)   BMI 29.99 kg/m   Specialty Comments:  No specialty comments available.  PMFS History: Patient Active Problem List   Diagnosis Date Noted  . Left-sided Bell's palsy 01/18/2019  . CVA (cerebral vascular accident) (HCC) 12/07/2018  . Bell palsy 12/06/2018  . Hyperlipidemia  12/06/2018  . Foreign body, granuloma, skin 12/21/2015   Past Medical History:  Diagnosis Date  . Allergy   . Bell's palsy   . Hyperglycemia   . Hyperlipidemia     Family History  Problem Relation Age of Onset  . Diabetes Mother   . Stroke Mother   . Prostate cancer Father   . Cancer Maternal Grandfather   . Diabetes Sister     Past Surgical History:  Procedure Laterality Date  . VASECTOMY     Social History   Occupational History  . 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 Smoking status: Never Smoker  . Smokeless tobacco: Never Used  Substance and Sexual Activity  . Alcohol use: Yes    Comment: social  . Drug use: Not Currently  . Sexual activity: Not on file

## 2019-10-14 ENCOUNTER — Telehealth: Payer: Self-pay

## 2019-10-14 NOTE — Telephone Encounter (Signed)
Called pt and he is on the [employer] until 10/24/19 and then will be out of the [place] for a month. appt with Dr. Lajoyce Corners on 10/24/19 at 8:15 MRI is supposed to also be that day. Will see what we can do after office visit to get approval.

## 2019-10-24 ENCOUNTER — Ambulatory Visit (INDEPENDENT_AMBULATORY_CARE_PROVIDER_SITE_OTHER): Payer: Managed Care, Other (non HMO) | Admitting: Orthopedic Surgery

## 2019-10-24 ENCOUNTER — Other Ambulatory Visit: Payer: Managed Care, Other (non HMO)

## 2019-10-24 ENCOUNTER — Encounter: Payer: Self-pay | Admitting: Orthopedic Surgery

## 2019-10-24 VITALS — Ht 71.0 in | Wt 215.0 lb

## 2019-10-24 DIAGNOSIS — G8929 Other chronic pain: Secondary | ICD-10-CM | POA: Diagnosis not present

## 2019-10-24 DIAGNOSIS — M545 Low back pain, unspecified: Secondary | ICD-10-CM

## 2019-10-24 NOTE — Progress Notes (Signed)
Office Visit Note   Patient: Jeff Perez           Date of Birth: 1961/02/29           MRN: [id number] Visit Date: 10/24/2019              Requested by: No referring provider defined for this encounter. PCP: System, Pcp Not In  Chief Complaint  Patient presents with  . Lower Back - Follow-up      HPI: Patient is a 59 year old gentleman who presents in follow-up for lower back pain with radicular symptoms to the left hip.  Patient states that he had excellent relief with 20 mg of prednisone in the morning and has decreased relief with taking 10 mg of prednisone.  Patient has done limited physical therapy when he saw  Farris Has.  He states that this did not provide any relief.  Assessment & Plan: Visit Diagnoses:  1. Chronic low back pain, unspecified back pain laterality, unspecified whether sciatica present     Plan: We will set the patient up for formalized physical therapy for 6 weeks continue with the prednisone 10 mg with breakfast at follow-up evaluate for possible MRI scan if he is still symptomatic.  Follow-Up Instructions: Return in about 2 months (around 12/24/2019).   Ortho Exam  Patient is alert, oriented, no adenopathy, well-dressed, normal affect, normal respiratory effort. Examination patient has a negative straight leg raise bilaterally no focal motor weakness in either lower extremity he is still symptomatic with back pain radiating to the left hip.  Imaging: No results found. No images are attached to the encounter.  Labs: Lab Results  Component Value Date   HGBA1C 5.4 12/07/2018     Lab Results  Component Value Date   ALBUMIN 4.7 12/06/2018    No results found for: MG No results found for: VD25OH  No results found for: PREALBUMIN CBC EXTENDED Latest Ref Rng & Units 12/06/2018 12/06/2018  WBC 4.0 - 10.5 K/uL - 5.1  RBC 4.22 - 5.81 MIL/uL - 4.21(L)  HGB 13.0 - 17.0 g/dL 54.0 08.6  HCT 39 - 52 % 40.0 40.0  PLT 150 - 400 K/uL - 186  NEUTROABS  1.7 - 7.7 K/uL - 2.9  LYMPHSABS 0.7 - 4.0 K/uL - 1.6     Body mass index is 29.99 kg/m.  Orders:  Orders Placed This Encounter  Procedures  . Ambulatory referral to Physical Therapy   No orders of the defined types were placed in this encounter.    Procedures: No procedures performed  Clinical Data: No additional findings.  ROS:  All other systems negative, except as noted in the HPI. Review of Systems  Objective: Vital Signs: Ht 5\' 11"  (1.803 m)   Wt 215 lb (97.5 kg)   BMI 29.99 kg/m   Specialty Comments:  No specialty comments available.  PMFS History: Patient Active Problem List   Diagnosis Date Noted  . Left-sided Bell's palsy 01/18/2019  . CVA (cerebral vascular accident) (HCC) 12/07/2018  . Bell palsy 12/06/2018  . Hyperlipidemia 12/06/2018  . Foreign body, granuloma, skin 12/21/2015   Past Medical History:  Diagnosis Date  . Allergy   . Bell's palsy   . Hyperglycemia   . Hyperlipidemia     Family History  Problem Relation Age of Onset  . Diabetes Mother   . Stroke Mother   . Prostate cancer Father   . Cancer Maternal Grandfather   . Diabetes Sister     Past Surgical History:  Procedure Laterality Date  . VASECTOMY     Social History   Occupational History  . 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 Smoking status: Never Smoker  . Smokeless tobacco: Never Used  Substance and Sexual Activity  . Alcohol use: Yes    Comment: social  . Drug use: Not Currently  . Sexual activity: Not on file

## 2019-11-08 ENCOUNTER — Telehealth: Payer: Self-pay | Admitting: Orthopedic Surgery

## 2019-11-08 NOTE — Telephone Encounter (Signed)
Called and sw pt earlier today and opened a spot on the sch for Thursday at 2:30

## 2019-11-08 NOTE — Telephone Encounter (Signed)
Patient called.   Wanted to be seen for his shoulder and back either Thursday or Friday afternoon. I told him we did not have anything available in his desired time frame nor for both problem areas. He told me to get in touch with Lajoyce Corners because they are close friends and he feels something should be worked out for him.   Call back: [phone]

## 2019-11-10 ENCOUNTER — Ambulatory Visit (INDEPENDENT_AMBULATORY_CARE_PROVIDER_SITE_OTHER): Payer: Managed Care, Other (non HMO) | Admitting: Physician Assistant

## 2019-11-10 ENCOUNTER — Encounter: Payer: Self-pay | Admitting: Physician Assistant

## 2019-11-10 DIAGNOSIS — M7541 Impingement syndrome of right shoulder: Secondary | ICD-10-CM

## 2019-11-10 MED ORDER — PREDNISONE 10 MG PO TABS: 20.0000 mg | ORAL_TABLET | Freq: Every day | ORAL | 0 refills | Status: AC

## 2019-11-10 NOTE — Progress Notes (Signed)
Office Visit Note   Patient: Jeff Perez           Date of Birth: 1960/11/16           MRN: [id number] Visit Date: 11/10/2019              Requested by: No referring provider defined for this encounter. PCP: System, Pcp Not In  No chief complaint on file.     HPI: This is a pleasant 59 year old gentleman who has been seeing Dr. Lajoyce Corners for chronic right low back pain.  He was to have an MRI but it has been denied by insurance because of no conservative treatment.  He was taking 20 mg of prednisone and cut down to 10.  Last week he was doing 25 push-ups which has not been a regular exercise for him for about 6 months.  Following this he began having twitching and pain in his right tricep and some radiating and into his shoulder.  All some associated neck pain.  Also pain on the lateral side of the left hip with some associated groin soreness the pain in the hip comes and goes.  He was able to take a long walk this morning without pain but other less activity seem to irritate it.  Assessment & Plan: Visit Diagnoses: No diagnosis found.  Plan: We will place him back on 20 mg of prednisone for the next 2 weeks.  He should follow-up with Dr. Lajoyce Corners at that time.  I also discussed trying an injection into his shoulder today to see if that helps he like to go forward and do this  Follow-Up Instructions: No follow-ups on file.   Ortho Exam  Patient is alert, oriented, no adenopathy, well-dressed, normal affect, normal respiratory effort. Focused examination of his right shoulder he has full range of motion with internal and external rotation good strength with resisted abduction he does have some reproduction of his pain with over the acromium.  Mild muscle tenderness down the triceps.  Good triceps strength.  No deformity of the neck good upper arm strength good lower extremity strength.  Left hip is currently not symptomatic no sign of infection or cellulitis   Imaging: No results  found. No images are attached to the encounter.  Labs: Lab Results  Component Value Date   HGBA1C 5.4 12/07/2018     Lab Results  Component Value Date   ALBUMIN 4.7 12/06/2018    No results found for: MG No results found for: VD25OH  No results found for: PREALBUMIN CBC EXTENDED Latest Ref Rng & Units 12/06/2018 12/06/2018  WBC 4.0 - 10.5 K/uL - 5.1  RBC 4.22 - 5.81 MIL/uL - 4.21(L)  HGB 13.0 - 17.0 g/dL 58.5 27.7  HCT 39 - 52 % 40.0 40.0  PLT 150 - 400 K/uL - 186  NEUTROABS 1.7 - 7.7 K/uL - 2.9  LYMPHSABS 0.7 - 4.0 K/uL - 1.6     There is no height or weight on file to calculate BMI.  Orders:  No orders of the defined types were placed in this encounter.  Meds ordered this encounter  Medications  . predniSONE (DELTASONE) 10 MG tablet    Sig: Take 2 tablets (20 mg total) by mouth daily with breakfast.    Dispense:  60 tablet    Refill:  0     Procedures: Large Joint Inj: R subacromial bursa on 11/10/2019 3:13 PM Indications: diagnostic evaluation and pain Details: 22 G 1.5 in needle, posterior approach  Arthrogram: No  Medications: 5 mL lidocaine 1 %; 40 mg methylPREDNISolone acetate 40 MG/ML Outcome: tolerated well, no immediate complications Procedure, treatment alternatives, risks and benefits explained, specific risks discussed. Consent was given by the patient.      Clinical Data: No additional findings.  ROS:  All other systems negative, except as noted in the HPI. Review of Systems  Objective: Vital Signs: There were no vitals taken for this visit.  Specialty Comments:  No specialty comments available.  PMFS History: Patient Active Problem List   Diagnosis Date Noted  . Left-sided Bell's palsy 01/18/2019  . CVA (cerebral vascular accident) (HCC) 12/07/2018  . Bell palsy 12/06/2018  . Hyperlipidemia 12/06/2018  . Foreign body, granuloma, skin 12/21/2015   Past Medical History:  Diagnosis Date  . Allergy   . Bell's palsy   .  Hyperglycemia   . Hyperlipidemia     Family History  Problem Relation Age of Onset  . Diabetes Mother   . Stroke Mother   . Prostate cancer Father   . Cancer Maternal Grandfather   . Diabetes Sister     Past Surgical History:  Procedure Laterality Date  . VASECTOMY     Social History   Occupational History  . 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 Smoking status: Never Smoker  . Smokeless tobacco: Never Used  Substance and Sexual Activity  . Alcohol use: Yes    Comment: social  . Drug use: Not Currently  . Sexual activity: Not on file

## 2019-11-11 MED ORDER — LIDOCAINE HCL 1 % IJ SOLN
5.0000 mL | INTRAMUSCULAR | Status: AC | PRN
  Administered 2019-11-10: 5 mL

## 2019-11-11 MED ORDER — METHYLPREDNISOLONE ACETATE 40 MG/ML IJ SUSP
40.0000 mg | INTRAMUSCULAR | Status: AC | PRN
  Administered 2019-11-10: 40 mg via INTRA_ARTICULAR

## 2019-11-28 ENCOUNTER — Encounter: Payer: Self-pay | Admitting: Orthopedic Surgery

## 2019-11-28 ENCOUNTER — Ambulatory Visit (INDEPENDENT_AMBULATORY_CARE_PROVIDER_SITE_OTHER): Payer: Managed Care, Other (non HMO) | Admitting: Orthopedic Surgery

## 2019-11-28 VITALS — Ht 71.0 in | Wt 215.0 lb

## 2019-11-28 DIAGNOSIS — M79605 Pain in left leg: Secondary | ICD-10-CM | POA: Diagnosis not present

## 2019-11-28 DIAGNOSIS — M545 Low back pain, unspecified: Secondary | ICD-10-CM

## 2019-11-28 DIAGNOSIS — M7541 Impingement syndrome of right shoulder: Secondary | ICD-10-CM | POA: Diagnosis not present

## 2019-11-28 DIAGNOSIS — M7711 Lateral epicondylitis, right elbow: Secondary | ICD-10-CM | POA: Diagnosis not present

## 2019-11-28 MED ORDER — METHYLPREDNISOLONE ACETATE 40 MG/ML IJ SUSP
40.0000 mg | INTRAMUSCULAR | Status: AC | PRN
  Administered 2019-11-28: 40 mg via INTRA_ARTICULAR

## 2019-11-28 MED ORDER — LIDOCAINE HCL 1 % IJ SOLN
2.0000 mL | INTRAMUSCULAR | Status: AC | PRN
  Administered 2019-11-28: 2 mL

## 2019-11-28 NOTE — Progress Notes (Signed)
Office Visit Note   Patient: Jeff Perez           Date of Birth: 1960-11-23           MRN: [id number] Visit Date: 11/28/2019              Requested by: No referring provider defined for this encounter. PCP: Pcp, No  Chief Complaint  Patient presents with  . Right Shoulder - Follow-up    S/p injection 11/10/19       HPI: Patient is a 59 year old gentleman who presents in follow-up for 3 separate issues.  #1 patient states he was doing some push-ups and has acute pain in the lateral epicondyle right elbow with pain radiating distally and proximally.  Patient has had chronic lower back pain with radiation into the lateral left hip he has recently been restarted on prednisone 20 mg with breakfast which she states has helped.  Patient also after doing push-ups had impingement symptoms of the right shoulder a subacromial injection did provide relief.  Assessment & Plan: Visit Diagnoses:  1. Lateral epicondylitis, right elbow   2. Impingement syndrome of right shoulder   3. Lumbar pain with radiation down left leg     Plan: Plan: Patient was given instructions for right shoulder rehab with internal and external rotation and scapular stabilization exercises.  Patient was given instructions for stretching and isometrics for the right elbow.  He is given a prescription for physical therapy will follow up in 4 weeks if he is still symptomatic we would need to consider an MRI scan of the lumbar spine.  Follow-Up Instructions: Return in about 4 weeks (around 12/26/2019).   Ortho Exam  Patient is alert, oriented, no adenopathy, well-dressed, normal affect, normal respiratory effort. Examination patient has a normal gait he has no pain with range of motion of the right hip no hip pathology negative straight leg raise with no focal motor weakness.  Patient's pain is primarily in the paraspinous muscles on the left side with radiating into the lateral left hip.  Patient has point tenderness to  palpation over the lateral condyle right elbow pain reproduced with resisted extension of the wrist.  Patient's right shoulder is asymptomatic at this time.  Imaging: No results found. No images are attached to the encounter.  Labs: Lab Results  Component Value Date   HGBA1C 5.4 12/07/2018     Lab Results  Component Value Date   ALBUMIN 4.7 12/06/2018    No results found for: MG No results found for: VD25OH  No results found for: PREALBUMIN CBC EXTENDED Latest Ref Rng & Units 12/06/2018 12/06/2018  WBC 4.0 - 10.5 K/uL - 5.1  RBC 4.22 - 5.81 MIL/uL - 4.21(L)  HGB 13.0 - 17.0 g/dL 80.3 21.2  HCT 39 - 52 % 40.0 40.0  PLT 150 - 400 K/uL - 186  NEUTROABS 1.7 - 7.7 K/uL - 2.9  LYMPHSABS 0.7 - 4.0 K/uL - 1.6     Body mass index is 29.99 kg/m.  Orders:  No orders of the defined types were placed in this encounter.  No orders of the defined types were placed in this encounter.    Procedures: Medium Joint Inj: R lateral epicondyle on 11/28/2019 8:41 AM Indications: pain and diagnostic evaluation Details: 22 G 1.5 in needle, lateral approach Medications: 2 mL lidocaine 1 %; 40 mg methylPREDNISolone acetate 40 MG/ML Outcome: tolerated well, no immediate complications Procedure, treatment alternatives, risks and benefits explained, specific risks discussed. Consent was  given by the patient. Immediately prior to procedure a time out was called to verify the correct patient, procedure, equipment, support staff and site/side marked as required. Patient was prepped and draped in the usual sterile fashion.      Clinical Data: No additional findings.  ROS:  All other systems negative, except as noted in the HPI. Review of Systems  Objective: Vital Signs: Ht 5\' 11"  (1.803 m)   Wt 215 lb (97.5 kg)   BMI 29.99 kg/m   Specialty Comments:  No specialty comments available.  PMFS History: Patient Active Problem List   Diagnosis Date Noted  . Left-sided Bell's palsy  01/18/2019  . CVA (cerebral vascular accident) (HCC) 12/07/2018  . Bell palsy 12/06/2018  . Hyperlipidemia 12/06/2018  . Foreign body, granuloma, skin 12/21/2015   Past Medical History:  Diagnosis Date  . Allergy   . Bell's palsy   . Hyperglycemia   . Hyperlipidemia     Family History  Problem Relation Age of Onset  . Diabetes Mother   . Stroke Mother   . Prostate cancer Father   . Cancer Maternal Grandfather   . Diabetes Sister     Past Surgical History:  Procedure Laterality Date  . VASECTOMY     Social History   Occupational History  . 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 Smoking status: Never Smoker  . Smokeless tobacco: Never Used  Substance and Sexual Activity  . Alcohol use: Yes    Comment: social  . Drug use: Not Currently  . Sexual activity: Not on file

## 2019-12-07 ENCOUNTER — Other Ambulatory Visit: Payer: Self-pay

## 2019-12-07 ENCOUNTER — Telehealth: Payer: Self-pay | Admitting: Orthopedic Surgery

## 2019-12-07 DIAGNOSIS — M7541 Impingement syndrome of right shoulder: Secondary | ICD-10-CM

## 2019-12-07 DIAGNOSIS — M7711 Lateral epicondylitis, right elbow: Secondary | ICD-10-CM

## 2019-12-07 NOTE — Telephone Encounter (Signed)
I called and sw pt. He had been given an rx for PT according to his office note and the pt can not remember what office that was for. I asked if he was interested in setting up PT upstairs in our office and he said this would be fine. Order entered for this and will await contact to schedule.

## 2019-12-07 NOTE — Telephone Encounter (Signed)
Pt called stating the PT facility hasn't called him to set any appts and he would like to know if we could check to make sure everything is ok?  [phone]

## 2019-12-14 ENCOUNTER — Ambulatory Visit: Payer: Managed Care, Other (non HMO)

## 2019-12-14 ENCOUNTER — Other Ambulatory Visit: Payer: Self-pay

## 2019-12-14 NOTE — Therapy (Signed)
[facility] Outpatient Rehabilitation [facility] [address] [place], [place], [address] Phone: [phone]   Fax:  [phone]  Physical Therapy Evaluation/To Be Repeated  Patient Details  Name: Josyah Achor MRN: [id number] Date of Birth: Mar 05, 1960 Referring Provider (PT): Nadara Mustard, MD    Encounter Date: 12/14/2019   PT End of Session - 12/14/19 1738    Visit Number 1    Authorization Type [employer]    PT Start Time 1740    PT Stop Time 1745    PT Time Calculation (min) 5 min    Activity Tolerance Patient tolerated treatment well    Behavior During Therapy [facility] for tasks assessed/performed           Past Medical History:  Diagnosis Date   Allergy    Bell's palsy    Hyperglycemia    Hyperlipidemia     Past Surgical History:  Procedure Laterality Date   VASECTOMY      There were no vitals filed for this visit.        [facility] PT Assessment - 12/14/19 0001      Assessment   Medical Diagnosis Lateral epicondylitis, right elbow M77.11, Impingement syndrome of right shoulder M75.41    Referring Provider (PT) Nadara Mustard, MD       Precautions   Precaution Comments --                      Objective measurements completed on examination: See above findings.                            Plan - 12/14/19 1809    Clinical Impression Statement Patient presented to outpatient PT evaluation mentioning that his R elbow pain has subsided since recently receiving an injection with exception of occasional numbness and tingling in ulnar distribution of R hand. Patient expresses that his primary concern is his low back with pain referring to L hip and down LLE. PT saw attachment for referral for R lateral epicondylitis and advised patient to return with referral for low back pain in order to address pt's concerns. Patient was understanding and receptive to this information. Upon further chart review, PT observed a  "closed" referral for low back pain that was within the past 6 months. Patient will be contacted to reschedule his evaluation at his convenience per PT and front office.           Patient will benefit from skilled therapeutic intervention in order to improve the following deficits and impairments:     Visit Diagnosis: Low back pain, unspecified back pain laterality, unspecified chronicity, unspecified whether sciatica present     Problem List Patient Active Problem List   Diagnosis Date Noted   Left-sided Bell's palsy 01/18/2019   CVA (cerebral vascular accident) (HCC) 12/07/2018   Bell palsy 12/06/2018   Hyperlipidemia 12/06/2018   Foreign body, granuloma, skin 12/21/2015    Rhea Bleacher, PT, DPT 12/14/19 6:28 PM  [facility] [address] [place], [place], [address] Phone: [phone]   Fax:  [phone]  Name: Klayton Monie MRN: [id number] Date of Birth: Aug 17, 1960

## 2019-12-26 ENCOUNTER — Encounter: Payer: Self-pay | Admitting: Orthopedic Surgery

## 2019-12-26 ENCOUNTER — Ambulatory Visit (INDEPENDENT_AMBULATORY_CARE_PROVIDER_SITE_OTHER): Payer: Managed Care, Other (non HMO) | Admitting: Orthopedic Surgery

## 2019-12-26 VITALS — Ht 71.0 in | Wt 215.0 lb

## 2019-12-26 DIAGNOSIS — M79605 Pain in left leg: Secondary | ICD-10-CM

## 2019-12-26 DIAGNOSIS — M7711 Lateral epicondylitis, right elbow: Secondary | ICD-10-CM | POA: Diagnosis not present

## 2019-12-26 DIAGNOSIS — M7541 Impingement syndrome of right shoulder: Secondary | ICD-10-CM

## 2019-12-26 DIAGNOSIS — M545 Low back pain, unspecified: Secondary | ICD-10-CM

## 2019-12-26 MED ORDER — PREDNISONE 10 MG PO TABS: 20.0000 mg | ORAL_TABLET | Freq: Every day | ORAL | 0 refills | Status: DC

## 2019-12-26 NOTE — Progress Notes (Signed)
Office Visit Note   Patient: Jeff Perez           Date of Birth: Jul 24, 1960           MRN: [id number] Visit Date: 12/26/2019              Requested by: No referring provider defined for this encounter. PCP: Pcp, No  Chief Complaint  Patient presents with  . Right Shoulder - Follow-up, Pain  . Right Elbow - Follow-up, Pain  . Lower Back - Follow-up, Pain      HPI: Patient is a 59 year old gentleman who presents in follow-up for medial epicondylitis right elbow with ulnar sided numbness and tingling into the little and ring finger.  Patient states the right shoulder impingement is doing much better he states that he has had 2 to 3 days of no back pain but the pain is reoccurred with left-sided lumbar pain with radiating into the left buttocks.  Patient states he is on prednisone 20 mg a day and this seems to help.  Assessment & Plan: Visit Diagnoses:  1. Lateral epicondylitis, right elbow   2. Impingement syndrome of right shoulder   3. Lumbar pain with radiation down left leg     Plan: We will refill his prescription for the prednisone he will try to wean off to 10 mg a day.  Recommended that he use a pillow rolled up to keep his elbow extended while sleeping discussed that a ulnar nerve transposition is a surgical option.  Patient states he has follow-up appointments with physical therapy.  Reevaluate in 5 weeks after his therapy.  Follow-Up Instructions: Return in about 4 weeks (around 01/23/2020).   Ortho Exam  Patient is alert, oriented, no adenopathy, well-dressed, normal affect, normal respiratory effort. Examination patient has normal gait no pain with range of motion of the shoulder or elbow there is tingling into the ring and little finger.  Patient has no medial or lateral epicondylitis pain at this time no radicular pain past the hip no focal motor weakness.  Imaging: No results found. No images are attached to the encounter.  Labs: Lab Results  Component  Value Date   HGBA1C 5.4 12/07/2018     Lab Results  Component Value Date   ALBUMIN 4.7 12/06/2018    No results found for: MG No results found for: VD25OH  No results found for: PREALBUMIN CBC EXTENDED Latest Ref Rng & Units 12/06/2018 12/06/2018  WBC 4.0 - 10.5 K/uL - 5.1  RBC 4.22 - 5.81 MIL/uL - 4.21(L)  HGB 13.0 - 17.0 g/dL 86.7 61.9  HCT 39 - 52 % 40.0 40.0  PLT 150 - 400 K/uL - 186  NEUTROABS 1.7 - 7.7 K/uL - 2.9  LYMPHSABS 0.7 - 4.0 K/uL - 1.6     Body mass index is 29.99 kg/m.  Orders:  No orders of the defined types were placed in this encounter.  No orders of the defined types were placed in this encounter.    Procedures: No procedures performed  Clinical Data: No additional findings.  ROS:  All other systems negative, except as noted in the HPI. Review of Systems  Objective: Vital Signs: Ht 5\' 11"  (1.803 m)   Wt 215 lb (97.5 kg)   BMI 29.99 kg/m   Specialty Comments:  No specialty comments available.  PMFS History: Patient Active Problem List   Diagnosis Date Noted  . Left-sided Bell's palsy 01/18/2019  . CVA (cerebral vascular accident) (HCC) 12/07/2018  . 02/06/2019  palsy 12/06/2018  . Hyperlipidemia 12/06/2018  . Foreign body, granuloma, skin 12/21/2015   Past Medical History:  Diagnosis Date  . Allergy   . Bell's palsy   . Hyperglycemia   . Hyperlipidemia     Family History  Problem Relation Age of Onset  . Diabetes Mother   . Stroke Mother   . Prostate cancer Father   . Cancer Maternal Grandfather   . Diabetes Sister     Past Surgical History:  Procedure Laterality Date  . VASECTOMY     Social History   Occupational History  . 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 Smoking status: Never Smoker  . Smokeless tobacco: Never Used  Substance and Sexual Activity  . Alcohol use: Yes    Comment: social  . Drug use: Not Currently  . Sexual activity: Not on file

## 2020-01-02 ENCOUNTER — Other Ambulatory Visit: Payer: Self-pay

## 2020-01-02 ENCOUNTER — Ambulatory Visit: Payer: Managed Care, Other (non HMO) | Attending: Orthopedic Surgery

## 2020-01-02 DIAGNOSIS — M2560 Stiffness of unspecified joint, not elsewhere classified: Secondary | ICD-10-CM | POA: Insufficient documentation

## 2020-01-02 DIAGNOSIS — G8929 Other chronic pain: Secondary | ICD-10-CM | POA: Insufficient documentation

## 2020-01-02 DIAGNOSIS — M25652 Stiffness of left hip, not elsewhere classified: Secondary | ICD-10-CM | POA: Diagnosis present

## 2020-01-02 DIAGNOSIS — M545 Low back pain, unspecified: Secondary | ICD-10-CM | POA: Insufficient documentation

## 2020-01-02 DIAGNOSIS — M25651 Stiffness of right hip, not elsewhere classified: Secondary | ICD-10-CM | POA: Insufficient documentation

## 2020-01-02 NOTE — Patient Instructions (Signed)
SK[facility], LTR  2-3 reps x 20-30 sec  2-3x/day and lateral

## 2020-01-02 NOTE — Therapy (Signed)
[facility] [address] [place], [place], [address] Phone: [phone]   Fax:  [phone]  Physical Therapy Evaluation  Patient Details  Name: Jeff Perez MRN: [id number] Date of Birth: [birth year] Referring Provider (PT): Jeff Mustard, MD    Encounter Date: 01/02/2020   PT End of Session - 01/02/20 1625    Visit Number 1    Number of Visits 12    Date for PT Re-Evaluation 02/10/20    Authorization Type [employer]    PT Start Time [phone]    PT Stop Time 0500    PT Time Calculation (min) 45 min    Activity Tolerance Patient tolerated treatment well    Behavior During Therapy [facility] for tasks assessed/performed           Past Medical History:  Diagnosis Date  . Allergy   . Bell's palsy   . Hyperglycemia   . Hyperlipidemia     Past Surgical History:  Procedure Laterality Date  . VASECTOMY      There were no vitals filed for this visit.    Subjective Assessment - 01/02/20 1618    Subjective He reports LBP.   He states LBP chronic on LT side. Various onsets and most time dull and may not last all day.   Walking up hill eases pain. Walking worse going down hill. Needs PT before MRI.  No ijury..    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 Yes    Pain Score 3     Pain Location Back    Pain Orientation Left;Lower    Pain Descriptors / Indicators Dull;Aching    Pain Type Chronic pain    Pain Radiating Towards Some times LT hip pain /inflamed can be anterior lateral and posterior    Pain Onset More than a month ago    Pain Frequency Intermittent    Aggravating Factors  not able to predict    Pain Relieving Factors no              [facility] PT Assessment - 01/02/20 0001      Assessment   Medical Diagnosis chronic Lt LBP    Referring Provider (PT) Jeff Mustard, MD     Onset Date/Surgical Date --   Years    Next MD Visit in 4-6  weeks    Prior Therapy No      Precautions   Precautions None      Restrictions   Weight Bearing Restrictions No      Balance Screen   Has the patient fallen in the past 6 months No      Prior Function   Level of Independence Independent      Cognition   Overall Cognitive Status Within Functional Limits for tasks assessed      Posture/Postural Control   Posture Comments RTR shoulder low and increased thoracic kyphosis  decr lumbar lordosis , LT pelvis high lateral shift RT.   RT foot ER      ROM / Strength   AROM / PROM / Strength AROM;PROM;Strength      AROM   AROM Assessment Site Lumbar    Lumbar Flexion 70    Lumbar Extension 10    Lumbar - Right Side Bend 15    Lumbar - Left Side Bend 15      PROM  Overall PROM Comments Bilateral hips tight but equal  No pain      Strength   Overall Strength Comments Normal LE  bilateral       Flexibility   Soft Tissue Assessment /Muscle Length yes    ITB + RT  and hip ext to 5 degrees Lt 10 degrees       Palpation   SI assessment  Supine LT leg shorter  prone ext with ILA LT drops   ASIS lower LT     AP to RT anterior ilia  stiff .     Palpation comment stiff with lateral push to pelvis      Ambulation/Gait   Ambulation/Gait No                      Objective measurements completed on examination: See above findings.                 PT Short Term Goals - 01/02/20 1717      PT SHORT TERM GOAL #1   Title he will be indpendent with intial HEP.    Time 3    Period Weeks    Status New      PT SHORT TERM GOAL #2   Title He will report 20% decr pain overall with walking    Time 3    Period Weeks    Status New             PT Long Term Goals - 01/02/20 1718      PT LONG TERM GOAL #1   Title He will report 75% decr pain with walking    Time 6    Period Weeks    Status New      PT LONG TERM GOAL #2   Title He will be independent with all HEP issued.    Time 6    Period Weeks     Status New                  Plan - 01/02/20 1713    Clinical Impression Statement Jeff Perez presents with intermittant LT sided lower back pain. He presents with some asymetries in pelvis and general posture. He was mildly tender to palpation in the Lt lower back in the area of L4-5 . When in painhe tends to increase pain with extended posture and ease in flexion.  Skilled Pt to progress HEP and do manual for increased flexibility and conosistent HEP should help ease pain.    Personal Factors and Comorbidities Time since onset of injury/illness/exacerbation    Examination-Activity Limitations Locomotion Level    Stability/Clinical Decision Making Stable/Uncomplicated    Clinical Decision Making Low    Rehab Potential Good    PT Frequency 2x / week    PT Duration 6 weeks    PT Treatment/Interventions Dry needling;Manual techniques;Patient/family education;Therapeutic activities;Therapeutic exercise;Electrical Stimulation;Iontophoresis 4mg /ml Dexamethasone;Ultrasound;Passive range of motion    PT Next Visit Plan Progess HEP with stretchin quads and hamstrings. Flexion based exercise and possible LT SI exercises    PT Home Exercise Plan LTR to RT , LT SKC    Consulted and Agree with Plan of Care Patient           Patient will benefit from skilled therapeutic intervention in order to improve the following deficits and impairments:  Pain, Postural dysfunction, Decreased activity tolerance, Decreased range of motion, Increased muscle spasms  Visit Diagnosis: Chronic left-sided low back pain without sciatica  Joint stiffness of spine  Stiffness of right hip, not elsewhere classified  Stiffness of left hip, not elsewhere classified     Problem List Patient Active Problem List   Diagnosis Date Noted  . Left-sided Bell's palsy 01/18/2019  . CVA (cerebral vascular accident) (HCC) 12/07/2018  . Bell palsy 12/06/2018  . Hyperlipidemia 12/06/2018  . Foreign body, granuloma, skin  12/21/2015    Jeff Perez  PT 01/02/2020, 5:19 PM  [facility] [address] [place], [place], [address] Phone: [phone]   Fax:  [phone]  Name: Jeff Perez MRN: [id number] Date of Birth: 1960/05/20

## 2020-01-03 ENCOUNTER — Ambulatory Visit: Payer: Managed Care, Other (non HMO)

## 2020-01-03 DIAGNOSIS — M545 Low back pain, unspecified: Secondary | ICD-10-CM

## 2020-01-03 DIAGNOSIS — M25651 Stiffness of right hip, not elsewhere classified: Secondary | ICD-10-CM

## 2020-01-03 DIAGNOSIS — M2560 Stiffness of unspecified joint, not elsewhere classified: Secondary | ICD-10-CM

## 2020-01-03 DIAGNOSIS — M25652 Stiffness of left hip, not elsewhere classified: Secondary | ICD-10-CM

## 2020-01-03 NOTE — Therapy (Signed)
[facility] Outpatient Rehabilitation [facility] [address] [place], [place], [address] Phone: [phone]   Fax:  [phone]  Physical Therapy Treatment  Patient Details  Name: Jeff Perez MRN: [id number] Date of Birth: 04-10-1960 Referring Provider (PT): Nadara Mustard, MD    Encounter Date: 01/03/2020   PT End of Session - 01/03/20 1658    Visit Number 2    Number of Visits 12    Date for PT Re-Evaluation 02/10/20    Authorization Type [employer]    PT Start Time 1700    PT Stop Time 1740    PT Time Calculation (min) 40 min    Activity Tolerance Patient tolerated treatment well    Behavior During Therapy [facility] for tasks assessed/performed           Past Medical History:  Diagnosis Date   Allergy    Bell's palsy    Hyperglycemia    Hyperlipidemia     Past Surgical History:  Procedure Laterality Date   VASECTOMY      There were no vitals filed for this visit.   Subjective Assessment - 01/03/20 1659    Subjective Pt states his low back pain is not bad today upon arrival.    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 Yes    Pain Score 2     Pain Location Back    Pain Orientation Left;Lower    Pain Descriptors / Indicators Aching;Dull    Pain Type Chronic pain    Pain Onset More than a month ago              [facility] PT Assessment - 01/03/20 0001      Assessment   Medical Diagnosis chronic Lt LBP    Referring Provider (PT) Nadara Mustard, MD                          [facility] Adult PT Treatment/Exercise - 01/03/20 0001      Exercises   Exercises Lumbar;Knee/Hip      Lumbar Exercises: Aerobic   Nustep L5 x 5 min      Lumbar Exercises: Seated   Other Seated Lumbar Exercises Forward flexion with green physioball 5 x 5 sec      Lumbar Exercises: Supine   Pelvic Tilt 20 reps    Pelvic Tilt Limitations 3 sec hold    Bridge with Ball  Squeeze 20 reps    Other Supine Lumbar Exercises LTR 15x to each direction    Other Supine Lumbar Exercises DKTC with BLE on green physioball x 20      Knee/Hip Exercises: Stretches   Active Hamstring Stretch Both;2 reps;30 seconds    Active Hamstring Stretch Limitations with green stretch strap    Hip Flexor Stretch 2 reps;30 seconds    Hip Flexor Stretch Limitations Modified Thomas stretch    Piriformis Stretch Left;2 reps;30 seconds      Knee/Hip Exercises: Aerobic   Nustep --                  PT Education - 01/03/20 2010    Education Details Pt education regarding flexion vs extension bias and HEP    Person(s) Educated Patient    Methods Explanation;Demonstration    Comprehension Verbalized understanding;Returned demonstration  PT Short Term Goals - 01/02/20 1717      PT SHORT TERM GOAL #1   Title he will be indpendent with intial HEP.    Time 3    Period Weeks    Status New      PT SHORT TERM GOAL #2   Title He will report 20% decr pain overall with walking    Time 3    Period Weeks    Status New             PT Long Term Goals - 01/02/20 1718      PT LONG TERM GOAL #1   Title He will report 75% decr pain with walking    Time 6    Period Weeks    Status New      PT LONG TERM GOAL #2   Title He will be independent with all HEP issued.    Time 6    Period Weeks    Status New                 Plan - 01/03/20 1659    Clinical Impression Statement Patient tolerated addition of interventions well with no complaints of increased pain. Pt was shown spine model and educated regarding flexion vs extension bias for lumbar spine.    Personal Factors and Comorbidities Time since onset of injury/illness/exacerbation    Examination-Activity Limitations Locomotion Level    Stability/Clinical Decision Making Stable/Uncomplicated    Rehab Potential Good    PT Frequency 2x / week    PT Duration 6 weeks    PT Treatment/Interventions Dry  needling;Manual techniques;Patient/family education;Therapeutic activities;Therapeutic exercise;Electrical Stimulation;Iontophoresis 4mg /ml Dexamethasone;Ultrasound;Passive range of motion    PT Next Visit Plan Review and update HEP PRN, continue with stretching quads and hamstrings. Flexion based exercise and possible LT SI exercises; core strengthening    PT Home Exercise Plan [id number] - LTR to RT , LT SKC, seated HS stretch, piriformis stretch, bridges, double knee to chest    Consulted and Agree with Plan of Care Patient           Patient will benefit from skilled therapeutic intervention in order to improve the following deficits and impairments:  Pain, Postural dysfunction, Decreased activity tolerance, Decreased range of motion, Increased muscle spasms  Visit Diagnosis: Chronic left-sided low back pain without sciatica  Joint stiffness of spine  Stiffness of right hip, not elsewhere classified  Stiffness of left hip, not elsewhere classified  Low back pain, unspecified back pain laterality, unspecified chronicity, unspecified whether sciatica present     Problem List Patient Active Problem List   Diagnosis Date Noted   Left-sided Bell's palsy 01/18/2019   CVA (cerebral vascular accident) (HCC) 12/07/2018   Bell palsy 12/06/2018   Hyperlipidemia 12/06/2018   Foreign body, granuloma, skin 12/21/2015     12/23/2015, PT, DPT 01/03/20 8:11 PM  [facility] Outpatient Rehabilitation [facility] [address] [place], [place], [place] Phone: [phone]   Fax:  [phone]  Name: Jeff Perez MRN: [name] Date of Birth: 01-Sep-1960

## 2020-01-14 ENCOUNTER — Ambulatory Visit: Payer: Managed Care, Other (non HMO)

## 2020-01-18 ENCOUNTER — Other Ambulatory Visit: Payer: Self-pay

## 2020-01-18 ENCOUNTER — Ambulatory Visit: Payer: Managed Care, Other (non HMO)

## 2020-01-18 DIAGNOSIS — M25651 Stiffness of right hip, not elsewhere classified: Secondary | ICD-10-CM

## 2020-01-18 DIAGNOSIS — M25652 Stiffness of left hip, not elsewhere classified: Secondary | ICD-10-CM

## 2020-01-18 DIAGNOSIS — M2560 Stiffness of unspecified joint, not elsewhere classified: Secondary | ICD-10-CM

## 2020-01-18 DIAGNOSIS — M545 Low back pain, unspecified: Secondary | ICD-10-CM | POA: Diagnosis not present

## 2020-01-18 DIAGNOSIS — G8929 Other chronic pain: Secondary | ICD-10-CM

## 2020-01-19 NOTE — Therapy (Addendum)
Jeff Perez, [place], [address] Phone: [phone]   Fax:  [phone]  Physical Therapy Treatment/Discharge  Patient Details  Name: Jeff Perez MRN: [id number] Date of Birth: [birth year] Referring Provider (PT): Newt Minion, MD    Encounter Date: 01/18/2020   PT End of Session - 01/19/20 0825    Visit Number 3    Number of Visits 12    Date for PT Re-Evaluation 02/10/20    Authorization Type [employer]    Progress Note Due on Visit 10    PT Start Time 1622    PT Stop Time 1700    PT Time Calculation (min) 38 min    Activity Tolerance Patient tolerated treatment well    Behavior During Therapy [facility] for tasks assessed/performed           Past Medical History:  Diagnosis Date  . Allergy   . Bell's palsy   . Hyperglycemia   . Hyperlipidemia     Past Surgical History:  Procedure Laterality Date  . VASECTOMY      There were no vitals filed for this visit.   Subjective Assessment - 01/19/20 0817    Subjective Pt reports he is not currently having low back or L hip, but earlier today he experienced 7/10. Pt states his low back and L hip usually feels better after completing the exs, but he is still having intermittent pain wiich is not consistent with time of day, position, or activity.    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 No/denies    Pain Location Back    Pain Orientation Left;Lower    Pain Descriptors / Indicators Aching;Dull    Pain Radiating Towards Some times LT hip pain /inflamed can be anterior lateral and posterior    Pain Onset More than a month ago    Pain Frequency Intermittent    Aggravating Factors  not able to predict    Pain Relieving Factors low back and L hip usually feel better after HEP              [facility] PT Assessment - 01/19/20 0001      Special Tests    Special Tests Hip Special Tests    Hip Special  Tests  Saralyn Pilar (FABER) Test;SI Compression;SI Distraction;Hip Scouring;Anterior Hip Impingement Test      Saralyn Pilar [facility]) Test   Findings Negative    Side Left      SI Compression   Findings Negative    Side Right   LT     SI Distraction   Findings  Negative    Side Right   Lt     Hip Scouring   Findings Negative    Side Right      Anterior Hip Impingement Test    Findings Negative    Side  Right                         [facility] Adult PT Treatment/Exercise - 01/19/20 0001      Lumbar Exercises: Standing   Other Standing Lumbar Exercises back ext c hands stabilizing hips 10x      Lumbar Exercises: Prone   Other Prone Lumbar Exercises prone press ups 10x2                  PT Education - 01/19/20 8676  Education Details HEP for trial of extension exs    Person(s) Educated Patient    Methods Explanation;Demonstration;Tactile cues;Verbal cues;Handout    Comprehension Verbalized understanding;Returned demonstration;Verbal cues required;Tactile cues required;Need further instruction            PT Short Term Goals - 01/02/20 1717      PT SHORT TERM GOAL #1   Title he will be indpendent with intial HEP.    Time 3    Period Weeks    Status New      PT SHORT TERM GOAL #2   Title He will report 20% decr pain overall with walking    Time 3    Period Weeks    Status New             PT Long Term Goals - 01/02/20 1718      PT LONG TERM GOAL #1   Title He will report 75% decr pain with walking    Time 6    Period Weeks    Status New      PT LONG TERM GOAL #2   Title He will be independent with all HEP issued.    Time 6    Period Weeks    Status New                 Plan - 01/19/20 0830    Clinical Impression Statement Pt is continuing to have intermittent low back and L hip pain which is not consisitent with time of day, positioning, or activity. Pt indicates his low back L hip pain usually feels better temporarily after  completing his HEP. Symptoms were not reporduced c L hip testing. Pt was instructed in a extensions exs which he is going to complete a trial of over the next 3 days. During PT today, prone press ups and standing ext did not alter the pt's pain level which continued to be 0/10, as it was at the beginning of the session.    Personal Factors and Comorbidities Time since onset of injury/illness/exacerbation    Examination-Activity Limitations Locomotion Level    Stability/Clinical Decision Making Stable/Uncomplicated    Clinical Decision Making Low    Rehab Potential Good    PT Frequency 2x / week    PT Treatment/Interventions Dry needling;Manual techniques;Patient/family education;Therapeutic activities;Therapeutic exercise;Electrical Stimulation;Iontophoresis 35m/ml Dexamethasone;Ultrasound;Passive range of motion    PT Next Visit Plan Assess response to trial of extension exs. Review and update HEP PRN, continue with stretching quads and hamstrings. Flexion based exercise and possible LT SI exercises; core strengthening.    PT Home Exercise Plan [id number] - LTR to RT , LT SKC, seated HS stretch, piriformis stretch, bridges, double knee to chest. Trial of extension exs    Consulted and Agree with Plan of Care Patient           Patient will benefit from skilled therapeutic intervention in order to improve the following deficits and impairments:  Pain, Postural dysfunction, Decreased activity tolerance, Decreased range of motion, Increased muscle spasms  Visit Diagnosis: Chronic left-sided low back pain without sciatica  Joint stiffness of spine  Stiffness of right hip, not elsewhere classified  Stiffness of left hip, not elsewhere classified  Low back pain, unspecified back pain laterality, unspecified chronicity, unspecified whether sciatica present     Problem List Patient Active Problem List   Diagnosis Date Noted  . Left-sided Bell's palsy 01/18/2019  . [place] 12/07/2018  . Bell palsy 12/06/2018  .  Hyperlipidemia 12/06/2018  . Foreign body, granuloma, skin 12/21/2015    Gar Ponto MS, PT 01/19/20 8:47 AM  [facility] [address] [place], [place], [address] Phone: [phone]   Fax:  [phone]  Name: Jeff Perez MRN: [id number] Date of Birth: 1960-08-26   PHYSICAL THERAPY DISCHARGE SUMMARY  Visits from Start of Care: 3  Current functional level related to goals / functional outcomes: See above   Remaining deficits: Unknown, pt stopped attending PT appts   Education / Equipment: HEP Plan: Patient agrees to discharge.  Patient goals were not met. Patient is being discharged due to not returning since the last visit.  ?????        Keniyah Gelinas MS, PT 05/10/20 12:41 PM

## 2020-01-21 ENCOUNTER — Ambulatory Visit: Payer: Managed Care, Other (non HMO)

## 2020-01-23 ENCOUNTER — Ambulatory Visit: Payer: Managed Care, Other (non HMO)

## 2020-01-30 ENCOUNTER — Ambulatory Visit (INDEPENDENT_AMBULATORY_CARE_PROVIDER_SITE_OTHER): Payer: Managed Care, Other (non HMO) | Admitting: Orthopedic Surgery

## 2020-01-30 ENCOUNTER — Encounter: Payer: Self-pay | Admitting: Orthopedic Surgery

## 2020-01-30 VITALS — Ht 71.0 in | Wt 215.0 lb

## 2020-01-30 DIAGNOSIS — M545 Low back pain, unspecified: Secondary | ICD-10-CM | POA: Diagnosis not present

## 2020-01-30 DIAGNOSIS — M79605 Pain in left leg: Secondary | ICD-10-CM | POA: Diagnosis not present

## 2020-01-30 NOTE — Progress Notes (Signed)
Office Visit Note   Patient: Jeff Perez           Date of Birth: 08-28-1960           MRN: [id number] Visit Date: 01/30/2020              Requested by: No referring provider defined for this encounter. PCP: Pcp, No  Chief Complaint  Patient presents with  . Lower Back - Follow-up  . Left Hip - Follow-up      HPI: Patient is a 59 year old gentleman who still has left-sided radicular pain down the lateral aspect of his left hip.  Patient states that he has had some temporary relief with prednisone 20 mg a day had essentially no relief with a 6-week course of physical therapy.  Assessment & Plan: Visit Diagnoses:  1. Lumbar pain with radiation down left leg     Plan: We will request an MRI scan to further evaluate the left-sided radicular symptoms anticipate patient could benefit from an epidural steroid injection  Follow-Up Instructions: Return if symptoms worsen or fail to improve, for Follow-up after MRI scan lumbar spine.   Ortho Exam  Patient is alert, oriented, no adenopathy, well-dressed, normal affect, normal respiratory effort. Examination patient has a normal gait he has a negative straight leg raise no focal motor weakness in either lower extremity pain radiates to the lateral aspect of the left thigh he has no pain with range of motion of the hip.  Imaging: No results found. No images are attached to the encounter.  Labs: Lab Results  Component Value Date   HGBA1C 5.4 12/07/2018     Lab Results  Component Value Date   ALBUMIN 4.7 12/06/2018    No results found for: MG No results found for: VD25OH  No results found for: PREALBUMIN CBC EXTENDED Latest Ref Rng & Units 12/06/2018 12/06/2018  WBC 4.0 - 10.5 K/uL - 5.1  RBC 4.22 - 5.81 MIL/uL - 4.21(L)  HGB 13.0 - 17.0 g/dL 91.7 91.5  HCT 39 - 52 % 40.0 40.0  PLT 150 - 400 K/uL - 186  NEUTROABS 1.7 - 7.7 K/uL - 2.9  LYMPHSABS 0.7 - 4.0 K/uL - 1.6     Body mass index is 29.99 kg/m.  Orders:    No orders of the defined types were placed in this encounter.  No orders of the defined types were placed in this encounter.    Procedures: No procedures performed  Clinical Data: No additional findings.  ROS:  All other systems negative, except as noted in the HPI. Review of Systems  Objective: Vital Signs: Ht 5\' 11"  (1.803 m)   Wt 215 lb (97.5 kg)   BMI 29.99 kg/m   Specialty Comments:  No specialty comments available.  PMFS History: Patient Active Problem List   Diagnosis Date Noted  . Left-sided Bell's palsy 01/18/2019  . CVA (cerebral vascular accident) (HCC) 12/07/2018  . Bell palsy 12/06/2018  . Hyperlipidemia 12/06/2018  . Foreign body, granuloma, skin 12/21/2015   Past Medical History:  Diagnosis Date  . Allergy   . Bell's palsy   . Hyperglycemia   . Hyperlipidemia     Family History  Problem Relation Age of Onset  . Diabetes Mother   . Stroke Mother   . Prostate cancer Father   . Cancer Maternal Grandfather   . Diabetes Sister     Past Surgical History:  Procedure Laterality Date  . VASECTOMY     Social History  Occupational History  . 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 Smoking status: Never Smoker  . Smokeless tobacco: Never Used  Substance and Sexual Activity  . Alcohol use: Yes    Comment: social  . Drug use: Not Currently  . Sexual activity: Not on file

## 2020-02-03 ENCOUNTER — Telehealth: Payer: Self-pay | Admitting: Orthopedic Surgery

## 2020-02-03 NOTE — Telephone Encounter (Signed)
Called patient left message to return call to schedule an MRI review appointment with Dr. Lajoyce Corners    MRI scheduled for 02/07/2020

## 2020-02-06 ENCOUNTER — Ambulatory Visit
Admission: RE | Admit: 2020-02-06 | Discharge: 2020-02-06 | Disposition: A | Discharge status: Home / Self Care | Payer: Managed Care, Other (non HMO) | Source: Ambulatory Visit | Attending: Orthopedic Surgery | Admitting: Orthopedic Surgery

## 2020-02-06 ENCOUNTER — Other Ambulatory Visit: Payer: Self-pay

## 2020-02-06 DIAGNOSIS — M545 Low back pain, unspecified: Secondary | ICD-10-CM

## 2020-02-06 DIAGNOSIS — M79605 Pain in left leg: Secondary | ICD-10-CM

## 2020-02-07 ENCOUNTER — Other Ambulatory Visit: Payer: Managed Care, Other (non HMO)

## 2020-02-20 ENCOUNTER — Ambulatory Visit: Payer: Managed Care, Other (non HMO) | Admitting: Orthopedic Surgery

## 2020-02-29 ENCOUNTER — Telehealth: Payer: Self-pay | Admitting: Orthopedic Surgery

## 2020-02-29 NOTE — Telephone Encounter (Signed)
Pt is calling for refill on Prednisone but also look at MRI. Just had this done earlier in the month and has not had review. Please advise

## 2020-02-29 NOTE — Telephone Encounter (Signed)
Pt would like a refill of his prednisone rx please and to be notified when it's sent in.   [phone]

## 2020-03-01 ENCOUNTER — Other Ambulatory Visit: Payer: Self-pay | Admitting: Orthopedic Surgery

## 2020-03-01 ENCOUNTER — Telehealth: Payer: Self-pay | Admitting: Orthopedic Surgery

## 2020-03-01 ENCOUNTER — Other Ambulatory Visit: Payer: Self-pay

## 2020-03-01 MED ORDER — PREDNISONE 10 MG PO TABS: 20.0000 mg | ORAL_TABLET | Freq: Every day | ORAL | 0 refills | Status: DC

## 2020-03-01 NOTE — Telephone Encounter (Signed)
Duplicate this has been done.  

## 2020-03-01 NOTE — Telephone Encounter (Signed)
Appt sch with FN per Dr. Lajoyce Corners ESi L spine

## 2020-03-01 NOTE — Telephone Encounter (Signed)
Patient called about update of medication. Please call patient about this matter and please send refill to pharmacy on file. Patient phone number is [phone].

## 2020-03-01 NOTE — Telephone Encounter (Signed)
I called in a refill prescription for prednisone and reviewed the MRI scan with the patient.  He has facet hypertrophy and a disc at L4-5 to the left.

## 2020-03-12 ENCOUNTER — Other Ambulatory Visit: Payer: Self-pay

## 2020-03-12 ENCOUNTER — Encounter: Payer: Self-pay | Admitting: Physical Medicine and Rehabilitation

## 2020-03-12 ENCOUNTER — Ambulatory Visit (INDEPENDENT_AMBULATORY_CARE_PROVIDER_SITE_OTHER): Payer: Managed Care, Other (non HMO) | Admitting: Physical Medicine and Rehabilitation

## 2020-03-12 ENCOUNTER — Ambulatory Visit: Payer: Self-pay

## 2020-03-12 VITALS — BP 146/88 | HR 67

## 2020-03-12 DIAGNOSIS — M4316 Spondylolisthesis, lumbar region: Secondary | ICD-10-CM

## 2020-03-12 DIAGNOSIS — M5416 Radiculopathy, lumbar region: Secondary | ICD-10-CM | POA: Diagnosis not present

## 2020-03-12 DIAGNOSIS — M5116 Intervertebral disc disorders with radiculopathy, lumbar region: Secondary | ICD-10-CM

## 2020-03-12 MED ORDER — METHYLPREDNISOLONE ACETATE 80 MG/ML IJ SUSP: 80.0000 mg | Freq: Once | INTRAMUSCULAR | Status: DC

## 2020-03-12 NOTE — Procedures (Signed)
Lumbosacral Transforaminal Epidural Steroid Injection - Sub-Pedicular Approach with Fluoroscopic Guidance  Patient: Jeff Perez      Date of Birth: 1960/05/21 MRN: [id number] PCP: Pcp, No      Visit Date: 03/12/2020   Universal Protocol:    Date/Time: 03/12/2020  Consent Given By: the patient  Position: PRONE  Additional Comments: Vital signs were monitored before and after the procedure. Patient was prepped and draped in the usual sterile fashion. The correct patient, procedure, and site was verified.   Injection Procedure Details:   Procedure diagnoses: Lumbar radiculopathy [M54.16]    Meds Administered:  Meds ordered this encounter  Medications  . methylPREDNISolone acetate (DEPO-MEDROL) injection 80 mg    Laterality: Left  Location/Site:  L4-L5  Needle:5.0 in., 22 ga.  Short bevel or Quincke spinal needle  Needle Placement: Transforaminal  Findings:    -Comments: Excellent flow of contrast along the nerve, nerve root and into the epidural space.  Procedure Details: After squaring off the end-plates to get a true AP view, the C-arm was positioned so that an oblique view of the foramen as noted above was visualized. The target area is just inferior to the "nose of the scotty dog" or sub pedicular. The soft tissues overlying this structure were infiltrated with 2-3 ml. of 1% Lidocaine without Epinephrine.  The spinal needle was inserted toward the target using a "trajectory" view along the fluoroscope beam.  Under AP and lateral visualization, the needle was advanced so it did not puncture dura and was located close the 6 O'Clock position of the pedical in AP tracterory. Biplanar projections were used to confirm position. Aspiration was confirmed to be negative for CSF and/or blood. A 1-2 ml. volume of Isovue-250 was injected and flow of contrast was noted at each level. Radiographs were obtained for documentation purposes.   After attaining the desired flow of  contrast documented above, a 0.5 to 1.0 ml test dose of 0.25% Marcaine was injected into each respective transforaminal space.  The patient was observed for 90 seconds post injection.  After no sensory deficits were reported, and normal lower extremity motor function was noted,   the above injectate was administered so that equal amounts of the injectate were placed at each foramen (level) into the transforaminal epidural space.   Additional Comments:  The patient tolerated the procedure well Dressing: 2 x 2 sterile gauze and Band-Aid    Post-procedure details: Patient was observed during the procedure. Post-procedure instructions were reviewed.  Patient left the clinic in stable condition.

## 2020-03-12 NOTE — Progress Notes (Signed)
Jeff Perez - 60 y.o. male MRN [id number]  Date of birth: 05/08/1960  Office Visit Note: Visit Date: 03/12/2020 PCP: Pcp, No Referred by: Nadara Mustard, MD  Subjective: Chief Complaint  Patient presents with  . Lower Back - Pain  . Left Hip - Pain   HPI:  Jeff Perez is a 60 y.o. male who comes in today at the request of Dr. Aldean Baker for planned Left L4-L5 Lumbar epidural steroid injection with fluoroscopic guidance.  The patient has failed conservative care including home exercise, medications, time and activity modification.  This injection will be diagnostic and hopefully therapeutic.  Please see requesting physician notes for further details and justification.  He is having left lateral hip and posterior lateral hip pain and some pain anteriorly but nothing really across the thigh or down the leg.  He gets a lot of back pain as well.  Does have arthritis and small listhesis.  MRI reviewed with images and spine model.  MRI reviewed in the note below.    ROS Otherwise per HPI.  Assessment & Plan: Visit Diagnoses:    ICD-10-CM   1. Lumbar radiculopathy  M54.16 XR C-ARM NO REPORT    Epidural Steroid injection    methylPREDNISolone acetate (DEPO-MEDROL) injection 80 mg  2. Radiculopathy due to lumbar intervertebral disc disorder  M51.16   3. Spondylolisthesis of lumbar region  M43.16     Plan: No additional findings.   Meds & Orders:  Meds ordered this encounter  Medications  . methylPREDNISolone acetate (DEPO-MEDROL) injection 80 mg    Orders Placed This Encounter  Procedures  . XR C-ARM NO REPORT  . Epidural Steroid injection    Follow-up: Return if symptoms worsen or fail to improve.   Procedures: No procedures performed  Lumbosacral Transforaminal Epidural Steroid Injection - Sub-Pedicular Approach with Fluoroscopic Guidance  Patient: Jeff Perez      Date of Birth: 08-May-1960 MRN: [id number] PCP: Pcp, No      Visit Date: 03/12/2020   Universal  Protocol:    Date/Time: 03/12/2020  Consent Given By: the patient  Position: PRONE  Additional Comments: Vital signs were monitored before and after the procedure. Patient was prepped and draped in the usual sterile fashion. The correct patient, procedure, and site was verified.   Injection Procedure Details:   Procedure diagnoses: Lumbar radiculopathy [M54.16]    Meds Administered:  Meds ordered this encounter  Medications  . methylPREDNISolone acetate (DEPO-MEDROL) injection 80 mg    Laterality: Left  Location/Site:  L4-L5  Needle:5.0 in., 22 ga.  Short bevel or Quincke spinal needle  Needle Placement: Transforaminal  Findings:    -Comments: Excellent flow of contrast along the nerve, nerve root and into the epidural space.  Procedure Details: After squaring off the end-plates to get a true AP view, the C-arm was positioned so that an oblique view of the foramen as noted above was visualized. The target area is just inferior to the "nose of the scotty dog" or sub pedicular. The soft tissues overlying this structure were infiltrated with 2-3 ml. of 1% Lidocaine without Epinephrine.  The spinal needle was inserted toward the target using a "trajectory" view along the fluoroscope beam.  Under AP and lateral visualization, the needle was advanced so it did not puncture dura and was located close the 6 O'Clock position of the pedical in AP tracterory. Biplanar projections were used to confirm position. Aspiration was confirmed to be negative for CSF and/or blood. A 1-2 ml. volume  of Isovue-250 was injected and flow of contrast was noted at each level. Radiographs were obtained for documentation purposes.   After attaining the desired flow of contrast documented above, a 0.5 to 1.0 ml test dose of 0.25% Marcaine was injected into each respective transforaminal space.  The patient was observed for 90 seconds post injection.  After no sensory deficits were reported, and normal  lower extremity motor function was noted,   the above injectate was administered so that equal amounts of the injectate were placed at each foramen (level) into the transforaminal epidural space.   Additional Comments:  The patient tolerated the procedure well Dressing: 2 x 2 sterile gauze and Band-Aid    Post-procedure details: Patient was observed during the procedure. Post-procedure instructions were reviewed.  Patient left the clinic in stable condition.      Clinical History: MRI LUMBAR SPINE WITHOUT CONTRAST  TECHNIQUE: Multiplanar, multisequence MR imaging of the lumbar spine was performed. No intravenous contrast was administered.  COMPARISON:  Lumbar radiographs July 19, 21.  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  Alignment:  Approximately 5 mm of retrolisthesis of L4 on L5.  Vertebrae: Vertebral body heights are maintained. Degenerative endplate signal changes about the L4-L5 disc. Scattered benign vertebral venous malformations. Otherwise, no focal marrow signal abnormality to suggest acute fracture, discitis/osteomyelitis, or suspicious bone lesion.  Conus medullaris and cauda equina: Conus extends to the L1-L2. Level. Conus and cauda equina appear normal.  Paraspinal and other soft tissues: Unremarkable.  Disc levels:  T12-L1: No significant disc protrusion, foraminal stenosis, or canal stenosis. Mild bilateral facet hypertrophy.  L1-L2: Mild disc bulge and moderate bilateral facet hypertrophy. Mild bilateral foraminal stenosis and canal stenosis.  L2-L3: Mild disc bulge and mild bilateral facet hypertrophy. No significant canal or foraminal stenosis.  L3-L4: Mild disc bulge and mild right greater than left facet hypertrophy. No significant canal or foraminal stenosis. Mild bilateral subarticular recess stenosis.  L4-L5: Degenerative disc disease with disc height loss and desiccation.  Approximately 5 mm of retrolisthesis of L4 on L5. Severe left facet hypertrophy. Left foraminal/extraforaminal disc protrusion. Resulting severe left foraminal stenosis. Extraforaminal disc contacts the exiting left nerve root. No significant central canal stenosis. Moderate left subarticular recess stenosis.  L5-S1: Only imaged on sagittal sequences. Mild-to-moderate bilateral foraminal stenosis. No significant central canal stenosis.  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  1. At L4-L5 there is severe left foraminal stenosis secondary to left facet hypertrophy and left foraminal/extraforaminal disc protrusion. Extraforaminal disc contacts the exited left nerve at this level. 2. Mild to moderate bilateral foraminal stenosis at L5-S1 and mild bilateral foraminal stenosis at L1-L2.   Electronically Signed   By: Feliberto Harts MD   On: 02/06/2020 08:31     Objective:  VS:  HT:    WT:   BMI:     BP:(!) 146/88  HR:67bpm  TEMP: ( )  RESP:  Physical Exam Vitals and nursing note reviewed.  Constitutional:      General: He is not in acute distress.    Appearance: Normal appearance. He is not ill-appearing.  HENT:     Head: Normocephalic and atraumatic.     Right Ear: External ear normal.     Left Ear: External ear normal.     Nose: No congestion.  Eyes:     Extraocular Movements: Extraocular movements intact.  Cardiovascular:     Rate and Rhythm: Normal rate.     Pulses: Normal pulses.  Pulmonary:     Effort: Pulmonary effort is normal. No respiratory distress.  Abdominal:     General: There is no distension.     Palpations: Abdomen is soft.  Musculoskeletal:        General: No tenderness or signs of injury.     Cervical back: Neck supple.     Right lower leg: No edema.     Left lower leg: No edema.     Comments: Patient has good distal strength without clonus.  Skin:    Findings: No erythema or rash.  Neurological:      General: No focal deficit present.     Mental Status: He is alert and oriented to person, place, and time.     Sensory: No sensory deficit.     Motor: No weakness or abnormal muscle tone.     Coordination: Coordination normal.  Psychiatric:        Mood and Affect: Mood normal.        Behavior: Behavior normal.      Imaging: No results found.

## 2020-03-12 NOTE — Progress Notes (Signed)
Pt state lower back mostly left hip and sum in groin area. Pt state walking and standing for a long period of time makes the pain worse. Pt state he takes pain meds and use excise to help ease the pain.  Numeric Pain Rating Scale and Functional Assessment Average Pain 2   In the last MONTH (on 0-10 scale) has pain interfered with the following?  1. General activity like being  able to carry out your everyday physical activities such as walking, climbing stairs, carrying groceries, or moving a chair?  Rating(9)   +Driver, -BT, -Dye Allergies.

## 2020-04-13 ENCOUNTER — Telehealth: Payer: Self-pay | Admitting: Physical Medicine and Rehabilitation

## 2020-04-13 NOTE — Telephone Encounter (Signed)
Pt called stating he got an injection 4 weeks ago and it kind of helped but not completely so he would like to make another appt please  [phone]

## 2020-04-16 NOTE — Telephone Encounter (Signed)
Left L4-5 or L5-S1 interlam

## 2020-04-16 NOTE — Telephone Encounter (Signed)
Left L4 TF on 03/12/20. Please advise.

## 2020-04-16 NOTE — Telephone Encounter (Signed)
Authorization pending. Service order [id number].

## 2020-04-16 NOTE — Telephone Encounter (Signed)
Patient had 50% relief for about 2-3 weeks and was able to increase activities.

## 2020-04-17 NOTE — Telephone Encounter (Signed)
Scheduled

## 2020-04-17 NOTE — Telephone Encounter (Signed)
Additional clinical documentation sent to [employer] for review. Case is pending.

## 2020-04-18 NOTE — Telephone Encounter (Signed)
Denied by insurance. Scheduled for OV.

## 2020-05-01 ENCOUNTER — Encounter: Payer: Self-pay | Admitting: Physical Medicine and Rehabilitation

## 2020-05-01 ENCOUNTER — Other Ambulatory Visit: Payer: Self-pay

## 2020-05-01 ENCOUNTER — Telehealth: Payer: Self-pay | Admitting: Physical Medicine and Rehabilitation

## 2020-05-01 ENCOUNTER — Ambulatory Visit (INDEPENDENT_AMBULATORY_CARE_PROVIDER_SITE_OTHER): Payer: Managed Care, Other (non HMO) | Admitting: Physical Medicine and Rehabilitation

## 2020-05-01 VITALS — BP 137/87 | HR 67

## 2020-05-01 DIAGNOSIS — M5416 Radiculopathy, lumbar region: Secondary | ICD-10-CM

## 2020-05-01 DIAGNOSIS — G8929 Other chronic pain: Secondary | ICD-10-CM

## 2020-05-01 DIAGNOSIS — M5442 Lumbago with sciatica, left side: Secondary | ICD-10-CM

## 2020-05-01 DIAGNOSIS — M47816 Spondylosis without myelopathy or radiculopathy, lumbar region: Secondary | ICD-10-CM

## 2020-05-01 DIAGNOSIS — M5116 Intervertebral disc disorders with radiculopathy, lumbar region: Secondary | ICD-10-CM

## 2020-05-01 NOTE — Telephone Encounter (Signed)
Needs auth for left L4 TF when today's note is dictated. Scheduled for 3/7 pending auth.

## 2020-05-01 NOTE — Progress Notes (Signed)
  Numeric Pain Rating Scale and Functional Assessment Average Pain 4   In the last MONTH (on 0-10 scale) has pain interfered with the following?  1. General activity like being  able to carry out your everyday physical activities such as walking, climbing stairs, carrying groceries, or moving a chair?  Rating(4)     

## 2020-05-02 ENCOUNTER — Encounter: Payer: Self-pay | Admitting: Physical Medicine and Rehabilitation

## 2020-05-02 DIAGNOSIS — M5116 Intervertebral disc disorders with radiculopathy, lumbar region: Secondary | ICD-10-CM | POA: Insufficient documentation

## 2020-05-02 NOTE — Telephone Encounter (Signed)
Faxed over notes, pt insurance PENDING

## 2020-05-02 NOTE — Progress Notes (Signed)
Jeff Perez - 60 y.o. male MRN [id number]  Date of birth: 09-28-1960  Office Visit Note: Visit Date: 05/01/2020 PCP: Pcp, No Referred by: No ref. provider found  Subjective: Chief Complaint  Patient presents with  . Lower Back - Pain   HPI: Jeff Perez is a 60 y.o. male who comes in today For continued evaluation and management of chronic severe worsening left low back hip and thigh pain.  Patient reports initial 100% pain relief diagnostically with a left L4 transforaminal epidural steroid injection at the beginning of January.  He states that it took several days to get started but that it was wonderful for a few days and then overall it was about 50% relief for 2 to 3 weeks.  Symptoms have returned at this point.  He reports average pain of 4 out of 10 pain but it does really limit what he can do.  Is worse with standing and ambulating and twisting.  Better at rest and sitting.  He denies any paresthesia or focal weakness or new symptoms.  No red flag complaints.  He has had MRI showing foraminal paracentral disc herniation at L4-5 with left L4-5 facet hypertrophy contributing to stenosis of the lateral recess and some foramen.  No high-grade central stenosis.  He has had physical therapy and continues with home exercises.  All this is documented in the chart.  During the time that the injection was helping he had dramatically reduced medications to the point where he was not taking any medicines at all.  He increased his activity level and was able to really do most of the things he would like to do including walking for exercise.  Review of Systems  Musculoskeletal: Positive for back pain.       Left hip and thigh pain  All other systems reviewed and are negative.  Otherwise per HPI.  Assessment & Plan: Visit Diagnoses:    ICD-10-CM   1. Lumbar radiculopathy  M54.16   2. Radiculopathy due to lumbar intervertebral disc disorder  M51.16   3. 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 Chronic left-sided low back pain with left-sided sciatica  M54.42    G89.29      Plan: Findings:  Chronic worsening severe low back left hip and thigh pain consistent with an L4 radiculopathy radiculitis.  Exam shows pain distribution in the L4 dermatome.  Diagnostically he has had a left L4 transforaminal injection that gave him 100% relief for a few days and then over 50% relief for about 3 weeks and he did reduce his medications to really not taking any and he was able to increase his activity.  Has had physical therapy and he continues with home exercises.  He has tried and failed all other manner of conservative care.  This is been ongoing now for several months.  MRI findings as described in the HPI and in the report below.  At this point I think the next step is to repeat the left L4 transforaminal epidural steroid injection and see if he can get some therapeutic relief.  Depending on that relief and how long the relief is could consider interlaminar epidural injection versus spine surgery evaluation versus continued conservative care with medication changes. Procedures are done as part of a comprehensive orthopedic and pain management program with access to in-house orthopedics, spine surgery and physical therapy as well as access to [facility] biopsychosocial counseling if needed.    Meds &  Orders: No orders of the defined types were placed in this encounter.  No orders of the defined types were placed in this encounter.   Follow-up: Return for Repeat L4 transforaminal epidural steroid injection.   Procedures: No procedures performed      Clinical History: MRI LUMBAR SPINE WITHOUT CONTRAST  TECHNIQUE: Multiplanar, multisequence MR imaging of the lumbar spine was performed. No intravenous contrast was administered.  COMPARISON:  Lumbar radiographs July 19, 21.  FINDINGS: Segmentation: Transitional lumbosacral anatomy with  sacralization of L5.  Alignment:  Approximately 5 mm of retrolisthesis of L4 on L5.  Vertebrae: Vertebral body heights are maintained. Degenerative endplate signal changes about the L4-L5 disc. Scattered benign vertebral venous malformations. Otherwise, no focal marrow signal abnormality to suggest acute fracture, discitis/osteomyelitis, or suspicious bone lesion.  Conus medullaris and cauda equina: Conus extends to the L1-L2. Level. Conus and cauda equina appear normal.  Paraspinal and other soft tissues: Unremarkable.  Disc levels:  T12-L1: No significant disc protrusion, foraminal stenosis, or canal stenosis. Mild bilateral facet hypertrophy.  L1-L2: Mild disc bulge and moderate bilateral facet hypertrophy. Mild bilateral foraminal stenosis and canal stenosis.  L2-L3: Mild disc bulge and mild bilateral facet hypertrophy. No significant canal or foraminal stenosis.  L3-L4: Mild disc bulge and mild right greater than left facet hypertrophy. No significant canal or foraminal stenosis. Mild bilateral subarticular recess stenosis.  L4-L5: Degenerative disc disease with disc height loss and desiccation. Approximately 5 mm of retrolisthesis of L4 on L5. Severe left facet hypertrophy. Left foraminal/extraforaminal disc protrusion. Resulting severe left foraminal stenosis. Extraforaminal disc contacts the exiting left nerve root. No significant central canal stenosis. Moderate left subarticular recess stenosis.  L5-S1: Only imaged on sagittal sequences. Mild-to-moderate bilateral foraminal stenosis. No significant central canal stenosis.  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  1. At L4-L5 there is severe left foraminal stenosis secondary to left facet hypertrophy and left foraminal/extraforaminal disc protrusion. Extraforaminal disc contacts the exited left nerve at this level. 2. Mild to moderate bilateral foraminal stenosis at  L5-S1 and mild bilateral foraminal stenosis at L1-L2.   Electronically Signed   By: Feliberto Harts MD   On: 02/06/2020 08:31   He reports that he has never smoked. He has never used smokeless tobacco. No results for input(s): HGBA1C, LABURIC in the last 8760 hours.  Objective:  VS:  HT:    WT:   BMI:     BP:137/87  HR:67bpm  TEMP: ( )  RESP:  Physical Exam Vitals and nursing note reviewed.  Constitutional:      General: He is not in acute distress.    Appearance: Normal appearance. He is not ill-appearing.  HENT:     Head: Normocephalic and atraumatic.     Right Ear: External ear normal.     Left Ear: External ear normal.     Nose: No congestion.  Eyes:     Extraocular Movements: Extraocular movements intact.  Cardiovascular:     Rate and Rhythm: Normal rate.     Pulses: Normal pulses.  Pulmonary:     Effort: Pulmonary effort is normal. No respiratory distress.  Abdominal:     General: There is no distension.     Palpations: Abdomen is soft.  Musculoskeletal:        General: Tenderness present. No deformity or signs of injury.     Cervical back: Neck supple.     Right lower leg: No edema.     Left lower leg: No edema.  Comments: Patient has good distal strength without clonus.  He has no pain with hip internal or external rotation.  He does have tenderness over the left PSIS and lower back.  Some pain with facet loading for his low back but not his hip and thigh.  He has some dysesthesia in the L4 dermatome.  Skin:    Findings: No bruising, erythema or rash.  Neurological:     General: No focal deficit present.     Mental Status: He is alert and oriented to person, place, and time.     Sensory: Sensory deficit present.     Motor: No weakness or abnormal muscle tone.     Coordination: Coordination normal.     Gait: Gait abnormal.  Psychiatric:        Mood and Affect: Mood normal.        Behavior: Behavior normal.     Ortho Exam  Imaging: No results  found.  Past Medical/Family/Surgical/Social History: Medications & Allergies reviewed per EMR, new medications updated. Patient Active Problem List   Diagnosis Date Noted  . Radiculopathy due to lumbar intervertebral disc disorder 05/02/2020  . Left-sided Bell's palsy 01/18/2019  . CVA (cerebral vascular accident) (HCC) 12/07/2018  . Bell palsy 12/06/2018  . Hyperlipidemia 12/06/2018  . Foreign body, granuloma, skin 12/21/2015   Past Medical History:  Diagnosis Date  . Allergy   . Bell's palsy   . Hyperglycemia   . Hyperlipidemia    Family History  Problem Relation Age of Onset  . Diabetes Mother   . Stroke Mother   . Prostate cancer Father   . Cancer Maternal Grandfather   . Diabetes Sister    Past Surgical History:  Procedure Laterality Date  . VASECTOMY     Social History   Occupational History  . 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 Smoking status: Never Smoker  . Smokeless tobacco: Never Used  Substance and Sexual Activity  . Alcohol use: Yes    Comment: social  . Drug use: Not Currently  . Sexual activity: Not on file

## 2020-05-07 ENCOUNTER — Ambulatory Visit: Payer: Self-pay

## 2020-05-07 ENCOUNTER — Encounter: Payer: Self-pay | Admitting: Physical Medicine and Rehabilitation

## 2020-05-07 ENCOUNTER — Ambulatory Visit (INDEPENDENT_AMBULATORY_CARE_PROVIDER_SITE_OTHER): Payer: Managed Care, Other (non HMO) | Admitting: Physical Medicine and Rehabilitation

## 2020-05-07 ENCOUNTER — Other Ambulatory Visit: Payer: Self-pay

## 2020-05-07 VITALS — BP 135/87 | HR 61

## 2020-05-07 DIAGNOSIS — M5416 Radiculopathy, lumbar region: Secondary | ICD-10-CM

## 2020-05-07 MED ORDER — METHYLPREDNISOLONE ACETATE 80 MG/ML IJ SUSP
80.0000 mg | Freq: Once | INTRAMUSCULAR | Status: AC
  Administered 2020-05-07: 80 mg

## 2020-05-07 NOTE — Telephone Encounter (Signed)
Pt was approve [id number]

## 2020-05-07 NOTE — Patient Instructions (Signed)

## 2020-05-07 NOTE — Procedures (Signed)
Lumbosacral Transforaminal Epidural Steroid Injection - Sub-Pedicular Approach with Fluoroscopic Guidance  Patient: Jeff Perez      Date of Birth: April 04, 1960 MRN: [id number] PCP: Pcp, No      Visit Date: 05/07/2020   Universal Protocol:    Date/Time: 05/07/2020  Consent Given By: the patient  Position: PRONE  Additional Comments: Vital signs were monitored before and after the procedure. Patient was prepped and draped in the usual sterile fashion. The correct patient, procedure, and site was verified.   Injection Procedure Details:   Procedure diagnoses: Lumbar radiculopathy [M54.16]    Meds Administered:  Meds ordered this encounter  Medications  . methylPREDNISolone acetate (DEPO-MEDROL) injection 80 mg    Laterality: Left  Location/Site:  L4-L5  Needle:5.0 in., 22 ga.  Short bevel or Quincke spinal needle  Needle Placement: Transforaminal  Findings:    -Comments: Excellent flow of contrast along the nerve, nerve root and into the epidural space.  Procedure Details: After squaring off the end-plates to get a true AP view, the C-arm was positioned so that an oblique view of the foramen as noted above was visualized. The target area is just inferior to the "nose of the scotty dog" or sub pedicular. The soft tissues overlying this structure were infiltrated with 2-3 ml. of 1% Lidocaine without Epinephrine.  The spinal needle was inserted toward the target using a "trajectory" view along the fluoroscope beam.  Under AP and lateral visualization, the needle was advanced so it did not puncture dura and was located close the 6 O'Clock position of the pedical in AP tracterory. Biplanar projections were used to confirm position. Aspiration was confirmed to be negative for CSF and/or blood. A 1-2 ml. volume of Isovue-250 was injected and flow of contrast was noted at each level. Radiographs were obtained for documentation purposes.   After attaining the desired flow of  contrast documented above, a 0.5 to 1.0 ml test dose of 0.25% Marcaine was injected into each respective transforaminal space.  The patient was observed for 90 seconds post injection.  After no sensory deficits were reported, and normal lower extremity motor function was noted,   the above injectate was administered so that equal amounts of the injectate were placed at each foramen (level) into the transforaminal epidural space.   Additional Comments:  The patient tolerated the procedure well Dressing: 2 x 2 sterile gauze and Band-Aid    Post-procedure details: Patient was observed during the procedure. Post-procedure instructions were reviewed.  Patient left the clinic in stable condition.

## 2020-05-07 NOTE — Progress Notes (Signed)
Jeff Perez - 60 y.o. male MRN [id number]  Date of birth: May 05, 1960  Office Visit Note: Visit Date: 05/07/2020 PCP: Pcp, No Referred by: No ref. provider found  Subjective: Chief Complaint  Patient presents with  . Left Leg - Pain   HPI:  Jeff Perez is a 60 y.o. male who comes in today for planned repeat Left L4-L5 Lumbar epidural steroid injection with fluoroscopic guidance.  The patient has failed conservative care including home exercise, medications, time and activity modification.  This injection will be diagnostic and hopefully therapeutic.  Please see requesting physician notes for further details and justification. Patient received more than 50% pain relief from prior injection.   Referring: Dr. Glee Arvin   Prior injection did offer more than 50% relief as noted above and did give the patient more functional ability for activities of daily living and was also beneficial in that it did reduce their medication requirement.  They have had physical therapy and continue with home exercises.  Current medication management is not beneficial in increasing their functional status.  Procedures are done as part of a comprehensive orthopedic and pain management program with access to in-house orthopedics, spine surgery and physical therapy as well as access to [facility] biopsychosocial counseling if needed.   ROS Otherwise per HPI.  Assessment & Plan: Visit Diagnoses:    ICD-10-CM   1. Lumbar radiculopathy  M54.16 XR C-ARM NO REPORT    Epidural Steroid injection    methylPREDNISolone acetate (DEPO-MEDROL) injection 80 mg    Plan: No additional findings.   Meds & Orders:  Meds ordered this encounter  Medications  . methylPREDNISolone acetate (DEPO-MEDROL) injection 80 mg    Orders Placed This Encounter  Procedures  . XR C-ARM NO REPORT  . Epidural Steroid injection    Follow-up: Return if symptoms worsen or fail to improve.   Procedures: No procedures  performed  Lumbosacral Transforaminal Epidural Steroid Injection - Sub-Pedicular Approach with Fluoroscopic Guidance  Patient: Jeff Perez      Date of Birth: September 10, 1960 MRN: [id number] PCP: Pcp, No      Visit Date: 05/07/2020   Universal Protocol:    Date/Time: 05/07/2020  Consent Given By: the patient  Position: PRONE  Additional Comments: Vital signs were monitored before and after the procedure. Patient was prepped and draped in the usual sterile fashion. The correct patient, procedure, and site was verified.   Injection Procedure Details:   Procedure diagnoses: Lumbar radiculopathy [M54.16]    Meds Administered:  Meds ordered this encounter  Medications  . methylPREDNISolone acetate (DEPO-MEDROL) injection 80 mg    Laterality: Left  Location/Site:  L4-L5  Needle:5.0 in., 22 ga.  Short bevel or Quincke spinal needle  Needle Placement: Transforaminal  Findings:    -Comments: Excellent flow of contrast along the nerve, nerve root and into the epidural space.  Procedure Details: After squaring off the end-plates to get a true AP view, the C-arm was positioned so that an oblique view of the foramen as noted above was visualized. The target area is just inferior to the "nose of the scotty dog" or sub pedicular. The soft tissues overlying this structure were infiltrated with 2-3 ml. of 1% Lidocaine without Epinephrine.  The spinal needle was inserted toward the target using a "trajectory" view along the fluoroscope beam.  Under AP and lateral visualization, the needle was advanced so it did not puncture dura and was located close the 6 O'Clock position of the pedical in AP  tracterory. Biplanar projections were used to confirm position. Aspiration was confirmed to be negative for CSF and/or blood. A 1-2 ml. volume of Isovue-250 was injected and flow of contrast was noted at each level. Radiographs were obtained for documentation purposes.   After attaining the desired  flow of contrast documented above, a 0.5 to 1.0 ml test dose of 0.25% Marcaine was injected into each respective transforaminal space.  The patient was observed for 90 seconds post injection.  After no sensory deficits were reported, and normal lower extremity motor function was noted,   the above injectate was administered so that equal amounts of the injectate were placed at each foramen (level) into the transforaminal epidural space.   Additional Comments:  The patient tolerated the procedure well Dressing: 2 x 2 sterile gauze and Band-Aid    Post-procedure details: Patient was observed during the procedure. Post-procedure instructions were reviewed.  Patient left the clinic in stable condition.      Clinical History: MRI LUMBAR SPINE WITHOUT CONTRAST  TECHNIQUE: Multiplanar, multisequence MR imaging of the lumbar spine was performed. No intravenous contrast was administered.  COMPARISON:  Lumbar radiographs July 19, 21.  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  Alignment:  Approximately 5 mm of retrolisthesis of L4 on L5.  Vertebrae: Vertebral body heights are maintained. Degenerative endplate signal changes about the L4-L5 disc. Scattered benign vertebral venous malformations. Otherwise, no focal marrow signal abnormality to suggest acute fracture, discitis/osteomyelitis, or suspicious bone lesion.  Conus medullaris and cauda equina: Conus extends to the L1-L2. Level. Conus and cauda equina appear normal.  Paraspinal and other soft tissues: Unremarkable.  Disc levels:  T12-L1: No significant disc protrusion, foraminal stenosis, or canal stenosis. Mild bilateral facet hypertrophy.  L1-L2: Mild disc bulge and moderate bilateral facet hypertrophy. Mild bilateral foraminal stenosis and canal stenosis.  L2-L3: Mild disc bulge and mild bilateral facet hypertrophy. No significant canal or foraminal  stenosis.  L3-L4: Mild disc bulge and mild right greater than left facet hypertrophy. No significant canal or foraminal stenosis. Mild bilateral subarticular recess stenosis.  L4-L5: Degenerative disc disease with disc height loss and desiccation. Approximately 5 mm of retrolisthesis of L4 on L5. Severe left facet hypertrophy. Left foraminal/extraforaminal disc protrusion. Resulting severe left foraminal stenosis. Extraforaminal disc contacts the exiting left nerve root. No significant central canal stenosis. Moderate left subarticular recess stenosis.  L5-S1: Only imaged on sagittal sequences. Mild-to-moderate bilateral foraminal stenosis. No significant central canal stenosis.  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  1. At L4-L5 there is severe left foraminal stenosis secondary to left facet hypertrophy and left foraminal/extraforaminal disc protrusion. Extraforaminal disc contacts the exited left nerve at this level. 2. Mild to moderate bilateral foraminal stenosis at L5-S1 and mild bilateral foraminal stenosis at L1-L2.   Electronically Signed   By: Feliberto Harts MD   On: 02/06/2020 08:31     Objective:  VS:  HT:    WT:   BMI:     BP:135/87  HR:61bpm  TEMP: ( )  RESP:  Physical Exam Vitals and nursing note reviewed.  Constitutional:      General: He is not in acute distress.    Appearance: Normal appearance. He is not ill-appearing.  HENT:     Head: Normocephalic and atraumatic.     Right Ear: External ear normal.     Left Ear: External ear normal.     Nose: No congestion.  Eyes:     Extraocular Movements: Extraocular  movements intact.  Cardiovascular:     Rate and Rhythm: Normal rate.     Pulses: Normal pulses.  Pulmonary:     Effort: Pulmonary effort is normal. No respiratory distress.  Abdominal:     General: There is no distension.     Palpations: Abdomen is soft.  Musculoskeletal:        General: No  tenderness or signs of injury.     Cervical back: Neck supple.     Right lower leg: No edema.     Left lower leg: No edema.     Comments: Patient has good distal strength without clonus.  Skin:    Findings: No erythema or rash.  Neurological:     General: No focal deficit present.     Mental Status: He is alert and oriented to person, place, and time.     Sensory: No sensory deficit.     Motor: No weakness or abnormal muscle tone.     Coordination: Coordination normal.  Psychiatric:        Mood and Affect: Mood normal.        Behavior: Behavior normal.      Imaging: No results found.

## 2020-05-07 NOTE — Progress Notes (Signed)
Pt state left groin pain. Pt state standing for a long time makes the pain worse. Pt state he takes pain meds to help ease the pain. Pt has hx of inj on 03/12/20 pt state it worked and give hin 100% for a few weeks. Pt state he didn't have to take any meds after the inj.  Numeric Pain Rating Scale and Functional Assessment Average Pain 2   In the last MONTH (on 0-10 scale) has pain interfered with the following?  1. General activity like being  able to carry out your everyday physical activities such as walking, climbing stairs, carrying groceries, or moving a chair?  Rating(6)   +Driver, -BT, -Dye Allergies.

## 2020-05-17 ENCOUNTER — Telehealth: Payer: Self-pay

## 2020-05-17 ENCOUNTER — Telehealth: Payer: Self-pay | Admitting: Physical Medicine and Rehabilitation

## 2020-05-17 ENCOUNTER — Other Ambulatory Visit: Payer: Self-pay

## 2020-05-17 MED ORDER — PREDNISONE 10 MG PO TABS: 20.0000 mg | ORAL_TABLET | Freq: Every day | ORAL | 0 refills | Status: AC

## 2020-05-17 NOTE — Telephone Encounter (Signed)
Patient called he is requesting a rx refill for prednisone call [phone]

## 2020-05-17 NOTE — Telephone Encounter (Signed)
Rx has been sent to pharm. Ok per Dr. Lajoyce Corners

## 2020-05-17 NOTE — Telephone Encounter (Signed)
Patient called. Says the injection is not helping. Would like a call back. [phone]

## 2020-05-17 NOTE — Telephone Encounter (Signed)
Pt last inj was 05/07/20 left L4-L5 TF. Called pt and LVM #1 to call us back.

## 2020-05-18 ENCOUNTER — Telehealth: Payer: Self-pay | Admitting: Physical Medicine and Rehabilitation

## 2020-05-18 NOTE — Telephone Encounter (Signed)
Patient called. Missed a call from [facility]. Would like a call back. [phone]

## 2020-05-21 NOTE — Telephone Encounter (Signed)
Tell him that I did review the images again and it was a very good injection at the same level.  If he is having no relief at all and he still having a lot of leg pain then it may be wise to be evaluated by neurosurgery/spine surgery.  This could either be Dr. Marikay Alar or Dr. Otelia Sergeant in our office.  If he is having mostly back pain with some referral we could try facet joint injection.  The injection would be at the L4-5 level.

## 2020-05-28 NOTE — Telephone Encounter (Signed)
Pt would give [place] a call back.

## 2020-09-20 ENCOUNTER — Other Ambulatory Visit: Payer: Self-pay | Admitting: Neurosurgery

## 2020-09-20 DIAGNOSIS — M5126 Other intervertebral disc displacement, lumbar region: Secondary | ICD-10-CM

## 2020-10-05 IMAGING — MR MR HEAD W/O CM
6 series · 42 of 48 positions shown · non-contrast
Comparison: MRI head tense [DATE]

CLINICAL DATA: Rule out stroke.  Left lip symptoms.

EXAM:
MRI HEAD WITHOUT CONTRAST
TECHNIQUE: Multiplanar, multiecho pulse sequences of the brain and surrounding
structures were obtained without intravenous contrast.

[Series 2: DWI · axial · 3.0mm · 0.94mm/px · z∈[-125,+8]mm · 12 of 98 slices shown]
[im 1/98]
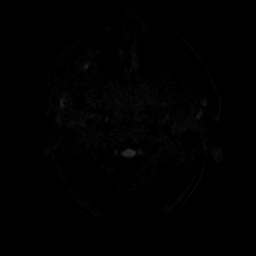
[im 6/98]
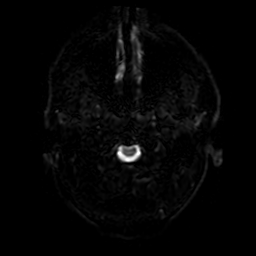
[im 12/98]
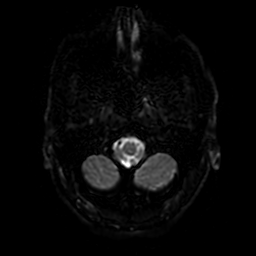
[im 18/98]
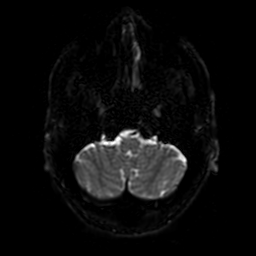
[im 29/98]
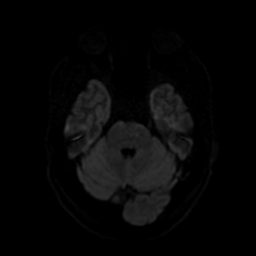
[im 40/98]
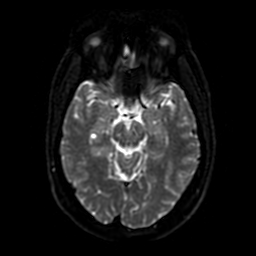
[im 52/98]
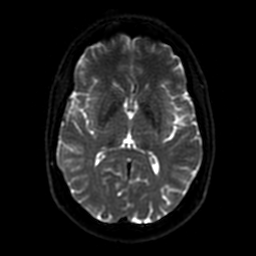
[im 58/98]
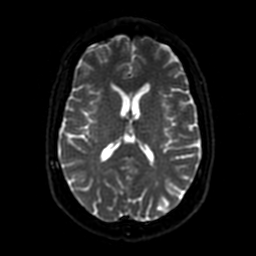
[im 69/98]
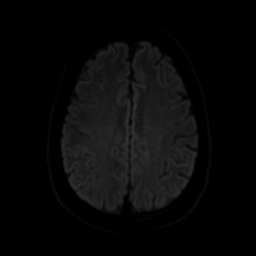
[im 80/98]
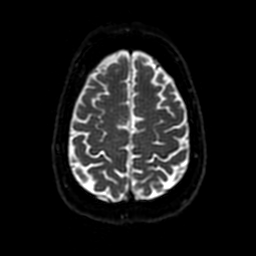
[im 86/98]
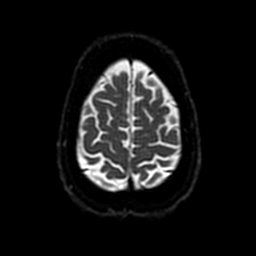
[im 92/98]
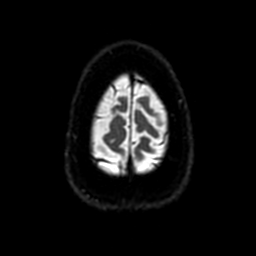

[Series 3: T2 · axial · 5.0mm · 0.47mm/px · z∈[-125,+17]mm · 5 of 25 slices shown]
[im 1/25]
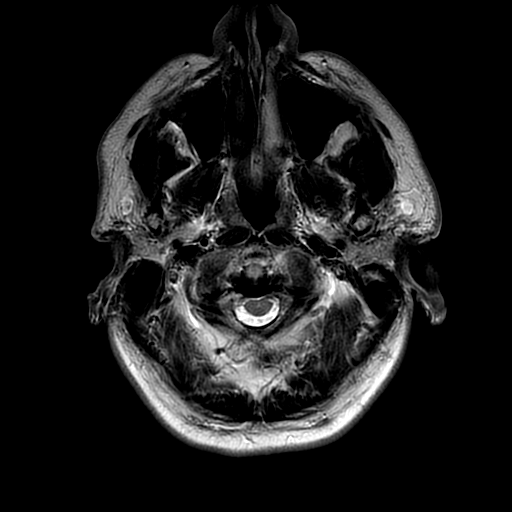
[im 7/25]
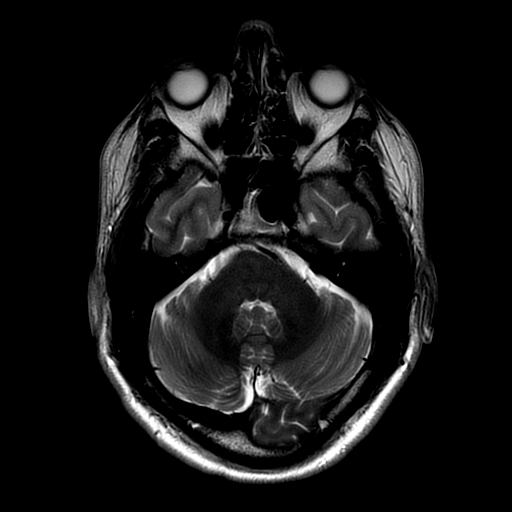
[im 13/25]
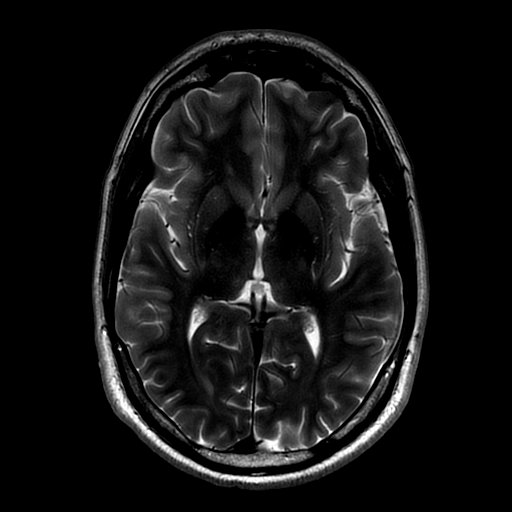
[im 19/25]
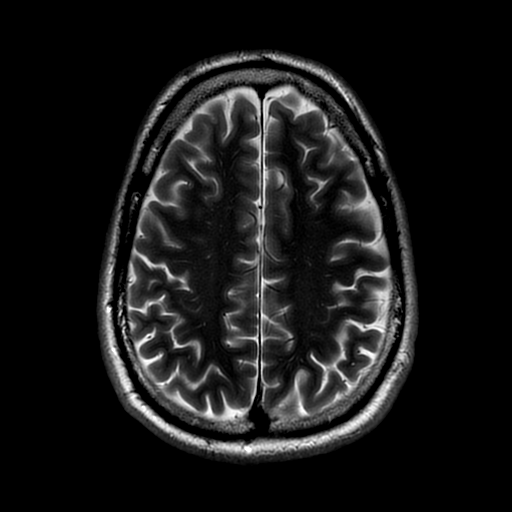
[im 25/25]
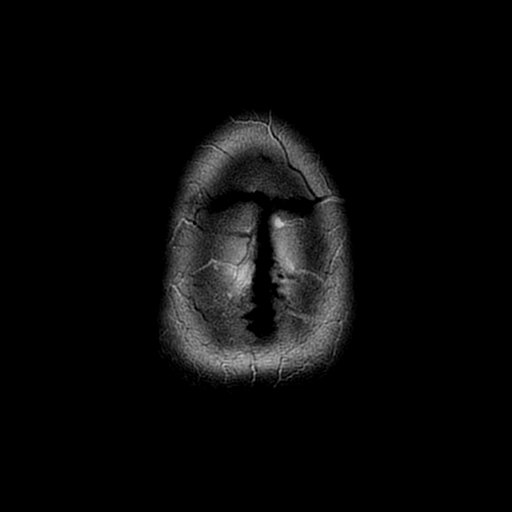

[Series 5: cor focus all · coronal · 3.0mm · 0.70mm/px · 5 of 27 slices shown]
[im 1/27]
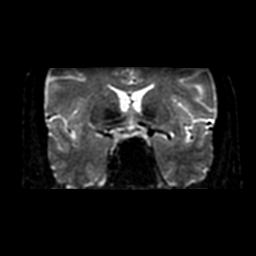
[im 7/27]
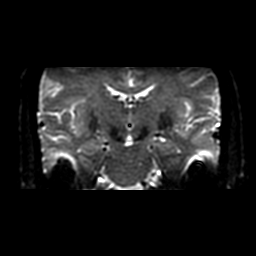
[im 14/27]
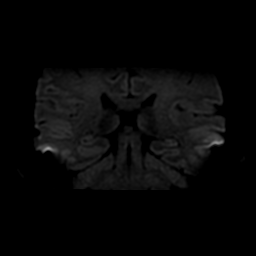
[im 20/27]
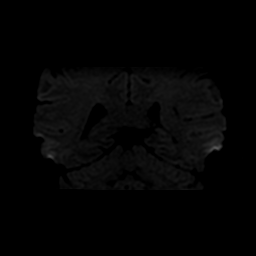
[im 27/27]
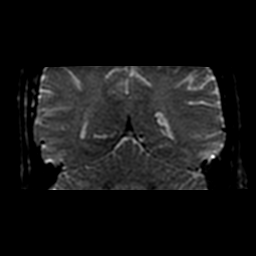

[Series 6: FLAIR · axial · 3.0mm · 0.47mm/px · z∈[-125,+17]mm · 5 of 25 slices shown]
[im 1/25]
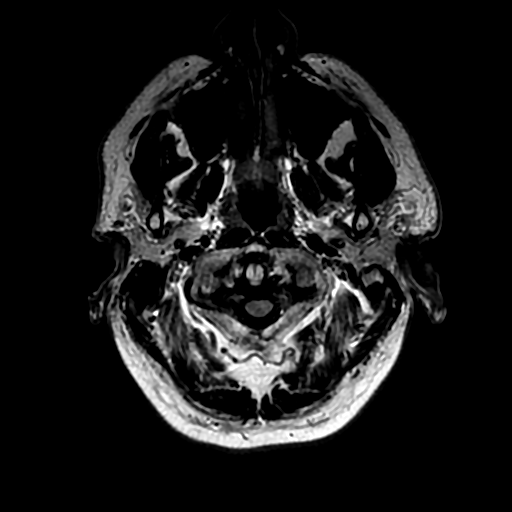
[im 7/25]
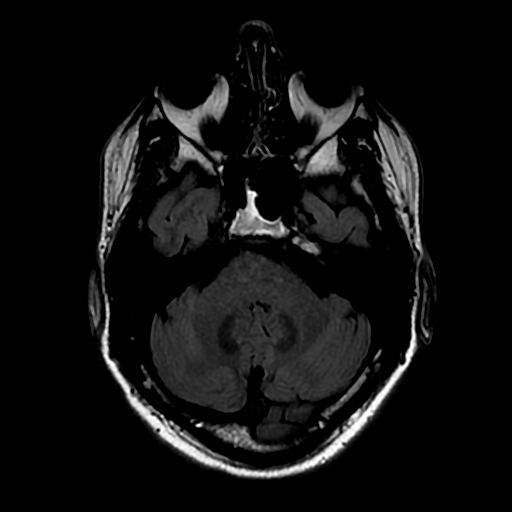
[im 13/25]
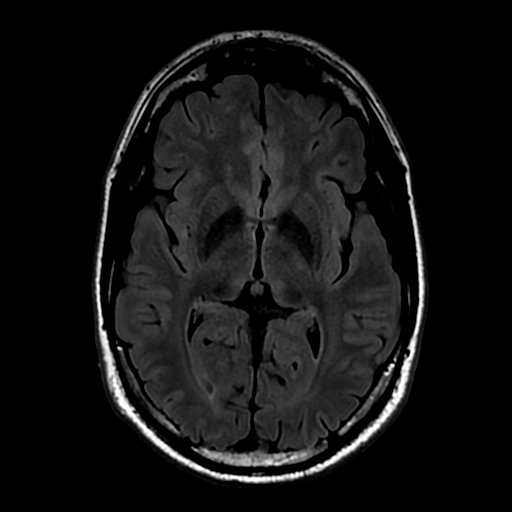
[im 19/25]
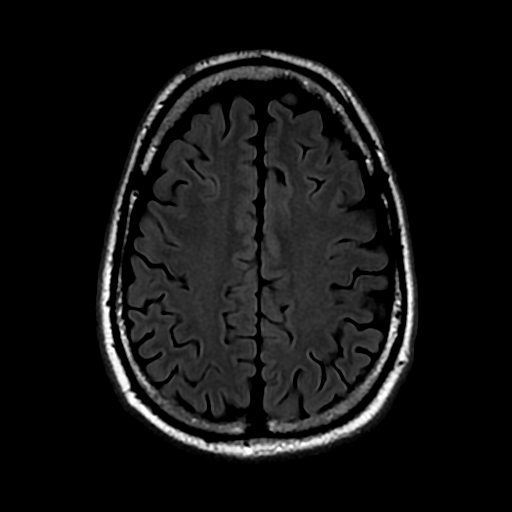
[im 25/25]
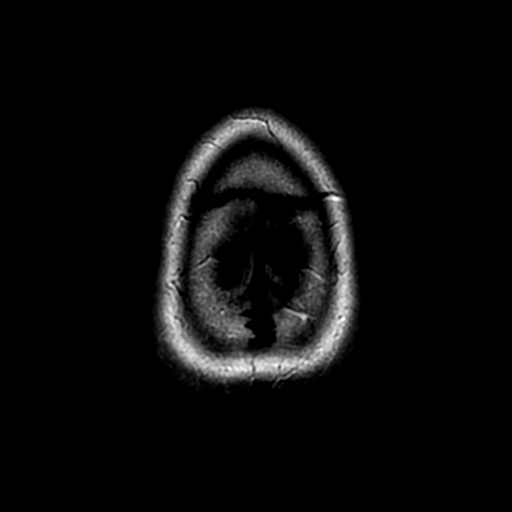

[Series 7: ax focus all · axial · 3.0mm · 0.70mm/px · z∈[-97,-38]mm · 6 of 32 slices shown]
[im 1/32]
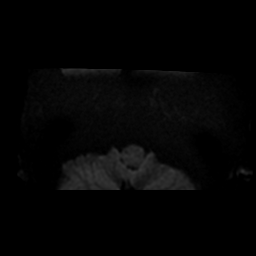
[im 7/32]
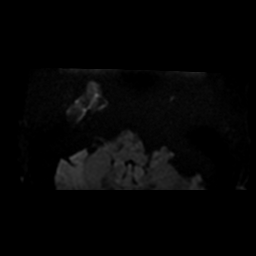
[im 13/32]
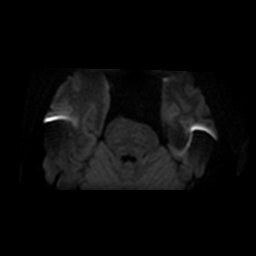
[im 19/32]
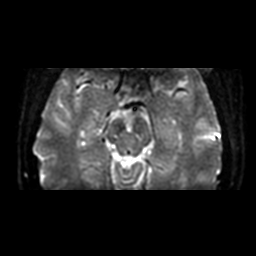
[im 25/32]
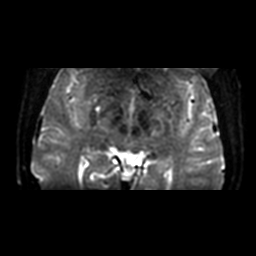
[im 32/32]
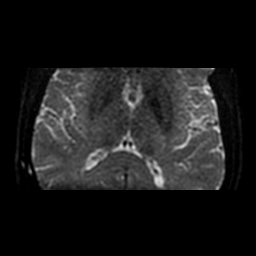

[Series 250: ADC · axial · 3.0mm · 0.94mm/px · z∈[-125,+17]mm · 9 of 49 slices shown]
[im 1/49]
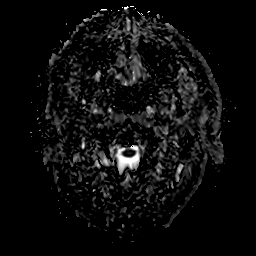
[im 7/49]
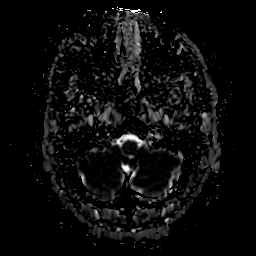
[im 13/49]
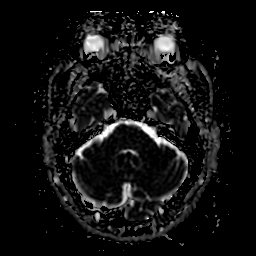
[im 19/49]
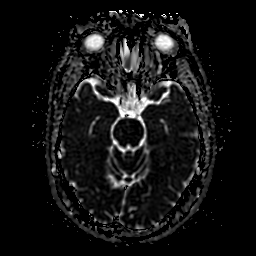
[im 25/49]
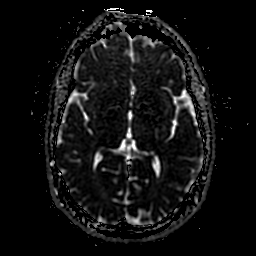
[im 31/49]
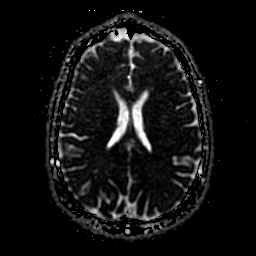
[im 37/49]
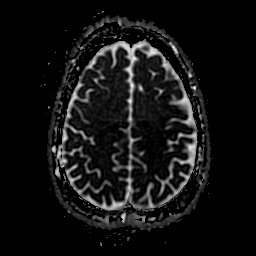
[im 43/49]
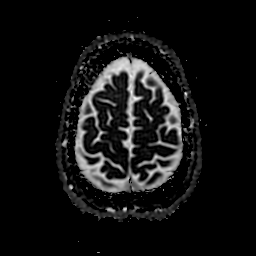
[im 49/49]
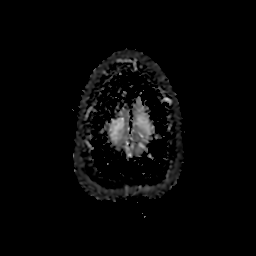

[42 of 48 positions shown; findings below may reference images not displayed]

FINDINGS: Limited in focus study of the brain to rule out acute infarct in the
pons as questioned on the MRI from 12/06/2018.

Thin-section coronal and axial diffusion weighted imaging performed
as well as axial FLAIR and T2.

Negative for acute infarct in the pons as questioned yesterday. No
acute infarct elsewhere in the brain.

Ventricle size normal.  Normal white matter.

Normal arterial flow voids.

Paranasal sinuses and orbit negative.
IMPRESSION: Negative for acute infarct with particular attention to the
brainstem.

## 2020-11-11 ENCOUNTER — Encounter (HOSPITAL_COMMUNITY): Payer: Self-pay | Admitting: Emergency Medicine

## 2020-11-11 ENCOUNTER — Emergency Department (HOSPITAL_COMMUNITY)
Admission: EM | Admit: 2020-11-11 | Discharge: 2020-11-11 | Disposition: A | Discharge status: Home / Self Care | Payer: Managed Care, Other (non HMO) | Attending: Emergency Medicine | Admitting: Emergency Medicine

## 2020-11-11 ENCOUNTER — Emergency Department (HOSPITAL_COMMUNITY): Payer: Managed Care, Other (non HMO)

## 2020-11-11 DIAGNOSIS — R0602 Shortness of breath: Secondary | ICD-10-CM | POA: Diagnosis not present

## 2020-11-11 DIAGNOSIS — I2693 Single subsegmental pulmonary embolism without acute cor pulmonale: Secondary | ICD-10-CM | POA: Insufficient documentation

## 2020-11-11 DIAGNOSIS — R0789 Other chest pain: Secondary | ICD-10-CM | POA: Diagnosis present

## 2020-11-11 LAB — CBC WITH DIFFERENTIAL/PLATELET
Abs Immature Granulocytes: 0.04 10*3/uL (ref 0.00–0.07)
Basophils Absolute: 0 10*3/uL (ref 0.0–0.1)
Basophils Relative: 0 %
Eosinophils Absolute: 0 10*3/uL (ref 0.0–0.5)
Eosinophils Relative: 1 %
HCT: 37.2 % — ABNORMAL LOW (ref 39.0–52.0)
Hemoglobin: 12.2 g/dL — ABNORMAL LOW (ref 13.0–17.0)
Immature Granulocytes: 1 %
Lymphocytes Relative: 20 %
Lymphs Abs: 1.7 10*3/uL (ref 0.7–4.0)
MCH: 31.2 pg (ref 26.0–34.0)
MCHC: 32.8 g/dL (ref 30.0–36.0)
MCV: 95.1 fL (ref 80.0–100.0)
Monocytes Absolute: 1 10*3/uL (ref 0.1–1.0)
Monocytes Relative: 12 %
Neutro Abs: 5.6 10*3/uL (ref 1.7–7.7)
Neutrophils Relative %: 66 %
Platelets: 305 10*3/uL (ref 150–400)
RBC: 3.91 MIL/uL — ABNORMAL LOW (ref 4.22–5.81)
RDW: 11.8 % (ref 11.5–15.5)
WBC: 8.4 10*3/uL (ref 4.0–10.5)
nRBC: 0 % (ref 0.0–0.2)

## 2020-11-11 LAB — PROTIME-INR
INR: 1 (ref 0.8–1.2)
Prothrombin Time: 13.3 seconds (ref 11.4–15.2)

## 2020-11-11 LAB — BASIC METABOLIC PANEL
Anion gap: 7 (ref 5–15)
BUN: 12 mg/dL (ref 8–23)
CO2: 26 mmol/L (ref 22–32)
Calcium: 9.1 mg/dL (ref 8.9–10.3)
Chloride: 105 mmol/L (ref 98–111)
Creatinine, Ser: 0.74 mg/dL (ref 0.61–1.24)
GFR, Estimated: >60 mL/min (ref >60)
Glucose, Bld: 100 mg/dL — ABNORMAL HIGH (ref 70–99)
Potassium: 3.8 mmol/L (ref 3.5–5.1)
Sodium: 138 mmol/L (ref 135–145)

## 2020-11-11 LAB — BRAIN NATRIURETIC PEPTIDE: B Natriuretic Peptide: 150.2 pg/mL — ABNORMAL HIGH (ref 0.0–100.0)

## 2020-11-11 LAB — LIPASE, BLOOD: Lipase: 34 U/L (ref 11–51)

## 2020-11-11 LAB — HEPATIC FUNCTION PANEL
ALT: 19 U/L (ref 0–44)
AST: 16 U/L (ref 15–41)
Albumin: 3.8 g/dL (ref 3.5–5.0)
Alkaline Phosphatase: 53 U/L (ref 38–126)
Bilirubin, Direct: 0.2 mg/dL (ref 0.0–0.2)
Indirect Bilirubin: 1 mg/dL — ABNORMAL HIGH (ref 0.3–0.9)
Total Bilirubin: 1.2 mg/dL (ref 0.3–1.2)
Total Protein: 7.1 g/dL (ref 6.5–8.1)

## 2020-11-11 LAB — TROPONIN I (HIGH SENSITIVITY)
Troponin I (High Sensitivity): 3 ng/L (ref <18)
Troponin I (High Sensitivity): 3 ng/L (ref <18)

## 2020-11-11 LAB — D-DIMER, QUANTITATIVE: D-Dimer, Quant: 1.65 ug/mL-FEU — ABNORMAL HIGH (ref 0.00–0.50)

## 2020-11-11 MED ORDER — APIXABAN 5 MG PO TABS: 5.0000 mg | ORAL_TABLET | Freq: Two times a day (BID) | ORAL | Status: DC

## 2020-11-11 MED ORDER — IOHEXOL 350 MG/ML SOLN
75.0000 mL | Freq: Once | INTRAVENOUS | Status: AC | PRN
  Administered 2020-11-11: 75 mL via INTRAVENOUS

## 2020-11-11 MED ORDER — APIXABAN (ELIQUIS) EDUCATION KIT FOR DVT/PE PATIENTS
PACK | Freq: Once | Status: AC
  Filled 2020-11-11: qty 1

## 2020-11-11 MED ORDER — APIXABAN 5 MG PO TABS
10.0000 mg | ORAL_TABLET | Freq: Two times a day (BID) | ORAL | Status: DC
  Administered 2020-11-11: 10 mg via ORAL
  Filled 2020-11-11: qty 2

## 2020-11-11 MED ORDER — APIXABAN (ELIQUIS) VTE STARTER PACK (10MG AND 5MG): ORAL_TABLET | ORAL | 0 refills | Status: AC

## 2020-11-11 MED ORDER — APIXABAN 5 MG PO TABS
10.0000 mg | ORAL_TABLET | Freq: Two times a day (BID) | ORAL | Status: DC
Start: 2020-11-11 — End: 2020-11-11

## 2020-11-11 NOTE — ED Provider Notes (Signed)
[facility] EMERGENCY DEPARTMENT Provider Note   CSN: [id number] Arrival date & time: 11/11/20  1248     History Chief Complaint  Patient presents with   Chest Pain   Back Pain    Jeff Perez is a 60 y.o. male with history of hyperlipidemia, recent lumbar surgery for radiculopathy, presenting to emergency department with right-sided chest pain.  The patient reports gradual onset of pain on the right side of his chest about 2 days ago.  He had a lower spinal surgery about 2 weeks ago.  He reports the chest pain is a tightness like he is lying on a fast, located in the right side of his chest.  He did feel at a point that it seemed to radiate more towards his back, but now is localized again towards the right lateral lower chest wall.  It is not worse with movement.  It is worse with deep inspiration.  He has never had this pain before.  Is currently mild intensity.  It does respond favorably to ibuprofen.  He called his surgeon who advised her to come to ED for further evaluation.  He denies fevers, cough, congestion, sore throat, nausea, vomiting, headache, abdominal pain, diarrhea.  No hemoptysis or asymmetric LE edema. Patient denies personal or family history of DVT or PE. No recent hormone use (including OCP); travel for >6 hours; prolonged immobilization for greater than 3 days; trauma in the last 4 weeks; or malignancy with treatment within 6 months.  Patient reports that his spinal surgery as a same-day procedure and he was mobile immediately afterwards  He denies history of hypertension, smoking, cardiac disease, coronary disease, MI.  He denies any history of exertional chest pain   HPI     Past Medical History:  Diagnosis Date   Allergy    Bell's palsy    Hyperglycemia    Hyperlipidemia     Patient Active Problem List   Diagnosis Date Noted   Radiculopathy due to lumbar intervertebral disc disorder 05/02/2020   Left-sided Bell's palsy 01/18/2019    CVA (cerebral vascular accident) ([place]) 12/07/2018   Bell palsy 12/06/2018   Hyperlipidemia 12/06/2018   Foreign body, granuloma, skin 12/21/2015    Past Surgical History:  Procedure Laterality Date   BACK SURGERY     VASECTOMY         Family History  Problem Relation Age of Onset   Diabetes Mother    Stroke Mother    Prostate cancer Father    Cancer Maternal Grandfather    Diabetes Sister     Social History   Tobacco Use   Smoking status: Never   Smokeless tobacco: Never  Substance Use Topics   Alcohol use: Yes    Comment: social   Drug use: Not Currently    Home Medications Prior to Admission medications   Medication Sig Start Date End Date Taking? Authorizing Provider  APIXABAN (ELIQUIS) VTE STARTER PACK (10MG AND 5MG) Take as directed on package: start with two-74m tablets twice daily for 7 days. On day 8, switch to one-527mtablet twice daily. 11/11/20  Yes Duanna Runk, MaCarola RhineMD  artificial tears (LACRILUBE) OINT ophthalmic ointment Place into the left eye at bedtime as needed for dry eyes. 12/07/18   XuRosalin HawkingMD  atorvastatin (LIPITOR) 40 MG tablet Take 40 mg by mouth daily. 11/11/18   [provider]  predniSONE (DELTASONE) 10 MG tablet Take 2 tablets (20 mg total) by mouth daily with breakfast. 11/10/19  Persons, Bevely Palmer, [place]  predniSONE (DELTASONE) 10 MG tablet Take 2 tablets (20 mg total) by mouth daily with breakfast. 05/17/20   Newt Minion, MD    Allergies    Patient has no known allergies.  Review of Systems   Review of Systems  Constitutional:  Negative for chills and fever.  Eyes:  Negative for pain and visual disturbance.  Respiratory:  Negative for cough and shortness of breath.   Cardiovascular:  Positive for chest pain. Negative for palpitations.  Gastrointestinal:  Negative for abdominal pain and vomiting.  Genitourinary:  Negative for dysuria and hematuria.  Musculoskeletal:  Positive for back pain. Negative for arthralgias and  myalgias.  Skin:  Negative for color change and rash.  Neurological:  Negative for syncope and headaches.  Psychiatric/Behavioral:  The patient is not nervous/anxious.   All other systems reviewed and are negative.  Physical Exam Updated Vital Signs BP 139/89   Pulse 61   Temp 98 F (36.7 C) (Oral)   Resp (!) 27   SpO2 100%   Physical Exam Constitutional:      General: He is not in acute distress. HENT:     Head: Normocephalic and atraumatic.  Eyes:     Conjunctiva/sclera: Conjunctivae normal.     Pupils: Pupils are equal, round, and reactive to light.  Cardiovascular:     Rate and Rhythm: Normal rate and regular rhythm.  Pulmonary:     Effort: Pulmonary effort is normal. No respiratory distress.  Abdominal:     General: There is no distension.     Tenderness: There is no abdominal tenderness.  Skin:    General: Skin is warm and dry.  Neurological:     General: No focal deficit present.     Mental Status: He is alert. Mental status is at baseline.  Psychiatric:        Mood and Affect: Mood normal.        Behavior: Behavior normal.    ED Results / Procedures / Treatments   Labs (all labs ordered are listed, but only abnormal results are displayed) Labs Reviewed  CBC WITH DIFFERENTIAL/PLATELET - Abnormal; Notable for the following components:      Result Value   RBC 3.91 (*)    Hemoglobin 12.2 (*)    HCT 37.2 (*)    All other components within normal limits  BASIC METABOLIC PANEL - Abnormal; Notable for the following components:   Glucose, Bld 100 (*)    All other components within normal limits  D-DIMER, QUANTITATIVE - Abnormal; Notable for the following components:   D-Dimer, Quant 1.65 (*)    All other components within normal limits  HEPATIC FUNCTION PANEL - Abnormal; Notable for the following components:   Indirect Bilirubin 1.0 (*)    All other components within normal limits  BRAIN NATRIURETIC PEPTIDE - Abnormal; Notable for the following components:    B Natriuretic Peptide 150.2 (*)    All other components within normal limits  PROTIME-INR  LIPASE, BLOOD  TROPONIN I (HIGH SENSITIVITY)  TROPONIN I (HIGH SENSITIVITY)    EKG EKG Interpretation  Date/Time:  Sunday November 11 2020 13:38:20 EDT Ventricular Rate:  68 PR Interval:  182 QRS Duration: 92 QT Interval:  404 QTC Calculation: 429 R Axis:   -28 Text Interpretation: Normal sinus rhythm Normal ECG Confirmed by Octaviano Glow [phone]) on 11/11/2020 3:01:54 PM  Radiology DG Chest 2 View  Result Date: 11/11/2020 CLINICAL DATA:  Shortness of breath EXAM: CHEST - 2  VIEW COMPARISON:  03/17/2007 FINDINGS: Trace right-sided pleural effusion. No consolidation. Borderline to mild cardiomegaly. No pneumothorax. IMPRESSION: Small right-sided pleural effusion.  Borderline to mild cardiomegaly Electronically Signed   By: Donavan Foil M.D.   On: 11/11/2020 15:10   CT Angio Chest PE W and/or Wo Contrast  Addendum Date: 11/11/2020   ADDENDUM REPORT: 11/11/2020 17:25 ADDENDUM: These results were called by telephone at the time of interpretation on 11/11/2020 at 5:15 pm to provider Renny Gunnarson , who verbally acknowledged these results. Electronically Signed   By: Fidela Salisbury M.D.   On: 11/11/2020 17:25   Result Date: 11/11/2020 CLINICAL DATA:  Suspected PE. EXAM: CT ANGIOGRAPHY CHEST WITH CONTRAST TECHNIQUE: Multidetector CT imaging of the chest was performed using the standard protocol during bolus administration of intravenous contrast. Multiplanar CT image reconstructions and MIPs were obtained to evaluate the vascular anatomy. CONTRAST:  61m OMNIPAQUE IOHEXOL 350 MG/ML SOLN COMPARISON:  None. FINDINGS: Cardiovascular: Satisfactory opacification of the pulmonary arteries to the segmental level. Small partially occlusive pulmonary emboli within segmental branches of the right lower lobe. Small clot burden. Normal heart size. No pericardial effusion or evidence of right heart strain.  Mediastinum/Nodes: No enlarged mediastinal, hilar, or axillary lymph nodes. Thyroid gland, trachea, and esophagus demonstrate no significant findings. Lungs/Pleura: Atelectasis versus streaky airspace consolidation in bilateral lung bases. Upper Abdomen: No acute abnormality. Musculoskeletal: No chest wall abnormality. No acute or significant osseous findings. Review of the MIP images confirms the above findings. IMPRESSION: 1. Small partially occlusive pulmonary emboli within segmental branches of the right lower lobe. Small clot burden. 2. Atelectasis versus streaky airspace consolidation in bilateral lung bases. Electronically Signed: By: DFidela SalisburyM.D. On: 11/11/2020 17:13    Procedures Procedures   Medications Ordered in ED Medications  apixaban (ELIQUIS) tablet 10 mg (10 mg Oral Given 11/11/20 1809)    Followed by  apixaban (ELIQUIS) tablet 5 mg (has no administration in time range)  iohexol (OMNIPAQUE) 350 MG/ML injection 75 mL (75 mLs Intravenous Contrast Given 11/11/20 1642)  apixaban (ELIQUIS) Education Kit for DVT/PE patients ( Does not apply Given by Other 11/11/20 1815)    ED Course  I have reviewed the triage vital signs and the nursing notes.  Pertinent labs & imaging results that were available during my care of the patient were reviewed by me and considered in my medical decision making (see chart for details).  This patient presents to the Emergency Department with complaint of chest pain. This involves an extensive number of treatment options, and is a complaint that carries with it a high risk of complications and morbidity.  The differential diagnosis includes PE vs ACS vs Pneumothorax vs Reflux/Gastritis vs MSK pain vs Pneumonia vs other.  He does not have any hypoxia or tachycardia or acute risk factors for PE, aside from a minor same-day surgery.  However with the description of his pain is pleuritic and localized, I do think is reasonable to get a D-dimer.  His  D-dimer subsequently came back positive, discussed with the patient a CT PE which he agrees with.  I ordered, reviewed, and interpreted labs, including BMP and CBC.  There were no immediate, life-threatening emergencies found in this labwork.  Trop 3. I ordered imaging studies which included DG chest, CT PE I independently visualized and interpreted imaging which showed small right-sided pleural effusion on the chest x-ray, CT PE showing subsegmental right sided acute PE with small effusion, and the monitor tracing which showed NSR I personally  reviewed the patients ECG which showed sinus rhythm with no acute ischemic findings  After the interventions stated above, I reevaluated the patient and found that they remained clinically stable.   Clinical Course as of 11/11/20 2239  Nancy Fetter Nov 11, 2020  1516 Ddimer positive, which may be expected in the setting of his recent surgery, but with his clinical picture we will proceed to CT PE [MT]  1758 Discussed the patient's diagnosis with him including the acute PE.  He is hemodynamically stable at this time.  No evidence of heart strain on the CT scan with a normal troponin.  No hypoxia.  I do think he is reasonably safe for outpatient follow-up, and can initiate Eliquis at this time.  We discussed the risk factors of Eliquis including bleeding, signs of GI bleeding, and return precautions particularly in the setting of trauma.  He verbalized understanding.  Also discussed the need for close outpatient follow-up with his PCP or hematologist, regarding further imaging.  He does not have any obvious signs of DVT at this time, but may need to Dopplers of the lower extremity and/or possible echocardiogram if felt appropriate by the outpatient specialist.  It is not clear to me whether this was a provoked PE given that he did have a surgical procedure, or the duration of therapy for his A/C, and explained he would need to discuss this with his doctor or hematologist.   He verbalized understanding.  I was able to answer all of his questions to his satisfaction. [MT]    Clinical Course User Index [MT] Tameem Pullara, Carola Rhine, MD    Final Clinical Impression(s) / ED Diagnoses Final diagnoses:  Single subsegmental pulmonary embolism without acute cor pulmonale ([place])    Rx / DC Orders ED Discharge Orders          Ordered    APIXABAN (ELIQUIS) VTE STARTER PACK (10MG AND 5MG)        11/11/20 1752             Wyvonnia Dusky, MD 11/11/20 2240

## 2020-11-11 NOTE — Discharge Instructions (Signed)
You were diagnosed with a pulmonary embolism today.  This is a blood clot in the right lower side of your lung.  This may have been related to the surgery you had done recently, however you will need further work-up for this.  Please contact your primary care doctor, or else you can schedule an appointment with one of our hematologists at the number above for new appointment.  You may need an outpatient screening doppler ultrasound of your legs and/or heart, if this is felt to be appropriate by your doctors.  These doctors should also need to extend your Eliquis prescription and will follow up with you on the duration of your blood thinner use.  Our prescription and starter pack will last 30 days.  Please take the time to read over the attached instructions regarding pulmonary embolism, paying particular attention to reasons to come back to the emergency department.

## 2020-11-11 NOTE — ED Triage Notes (Signed)
Pt had back surgery 2 weeks ago.  Reports R sided chest soreness that started yesterday.  Around midnight he developed SOB and pain across chest.  Took Advil that helped.  This morning woke up and pain only on R chest again with SOB.  Had difficulty singing at church and reports pain worse with deep inspiration.

## 2020-11-11 NOTE — ED Notes (Signed)
Patient verbalizes understanding of discharge instructions. Opportunity for questioning and answers were provided. Arm band removed by staff, patient discharged from ED. 

## 2020-11-11 NOTE — ED Provider Notes (Signed)
Emergency Medicine Provider Triage Evaluation Note  Jeff Perez , a 60 y.o. male  was evaluated in triage.  Pt complains of shortness of breath and right-sided chest pain in context of status post 2 weeks post spinal surgery.  Was directed to the ED for evaluation of PE by his surgeon.  Review of Systems  Positive: Shortness of breath, right chest pain Negative: Fevers, chills, palpitations, syncope  Physical Exam  BP 135/83 (BP Location: Right Arm)   Pulse 72   Temp 98.5 F (36.9 C)   Resp 18   SpO2 100%  Gen:   Awake, no distress   Resp:  Normal effort  MSK:   Moves extremities without difficulty  Other:  RRR no M/R/G.  Lungs CTA B.  Spinal incision healing very well.  Medical Decision Making  Medically screening exam initiated at 1:39 PM.  Appropriate orders placed.  [name] was informed that the remainder of the evaluation will be completed by another provider, this initial triage assessment does not replace that evaluation, and the importance of remaining in the ED until their evaluation is complete.  This chart was dictated using voice recognition software, Dragon. Despite the best efforts of this provider to proofread and correct errors, errors may still occur which can change documentation meaning.    Sherrilee Gilles 11/11/20 1353    Maia Plan, MD 11/18/20 1749

## 2020-12-14 ENCOUNTER — Emergency Department (HOSPITAL_BASED_OUTPATIENT_CLINIC_OR_DEPARTMENT_OTHER)
Admission: EM | Admit: 2020-12-14 | Discharge: 2020-12-14 | Disposition: A | Discharge status: Home / Self Care | Payer: Managed Care, Other (non HMO) | Attending: Emergency Medicine | Admitting: Emergency Medicine

## 2020-12-14 ENCOUNTER — Emergency Department (HOSPITAL_BASED_OUTPATIENT_CLINIC_OR_DEPARTMENT_OTHER): Payer: Managed Care, Other (non HMO)

## 2020-12-14 ENCOUNTER — Encounter (HOSPITAL_BASED_OUTPATIENT_CLINIC_OR_DEPARTMENT_OTHER): Payer: Self-pay | Admitting: Respiratory Therapy

## 2020-12-14 ENCOUNTER — Other Ambulatory Visit: Payer: Self-pay

## 2020-12-14 DIAGNOSIS — Z7901 Long term (current) use of anticoagulants: Secondary | ICD-10-CM | POA: Diagnosis not present

## 2020-12-14 DIAGNOSIS — R202 Paresthesia of skin: Secondary | ICD-10-CM | POA: Diagnosis not present

## 2020-12-14 LAB — CBC WITH DIFFERENTIAL/PLATELET
Abs Immature Granulocytes: 0.02 10*3/uL (ref 0.00–0.07)
Basophils Absolute: 0 10*3/uL (ref 0.0–0.1)
Basophils Relative: 1 %
Eosinophils Absolute: 0.2 10*3/uL (ref 0.0–0.5)
Eosinophils Relative: 3 %
HCT: 39.4 % (ref 39.0–52.0)
Hemoglobin: 13.1 g/dL (ref 13.0–17.0)
Immature Granulocytes: 0 %
Lymphocytes Relative: 33 %
Lymphs Abs: 1.9 10*3/uL (ref 0.7–4.0)
MCH: 31 pg (ref 26.0–34.0)
MCHC: 33.2 g/dL (ref 30.0–36.0)
MCV: 93.1 fL (ref 80.0–100.0)
Monocytes Absolute: 0.6 10*3/uL (ref 0.1–1.0)
Monocytes Relative: 10 %
Neutro Abs: 3.1 10*3/uL (ref 1.7–7.7)
Neutrophils Relative %: 53 %
Platelets: 261 10*3/uL (ref 150–400)
RBC: 4.23 MIL/uL (ref 4.22–5.81)
RDW: 12 % (ref 11.5–15.5)
WBC: 5.9 10*3/uL (ref 4.0–10.5)
nRBC: 0 % (ref 0.0–0.2)

## 2020-12-14 LAB — COMPREHENSIVE METABOLIC PANEL
ALT: 25 U/L (ref 0–44)
AST: 19 U/L (ref 15–41)
Albumin: 4.6 g/dL (ref 3.5–5.0)
Alkaline Phosphatase: 64 U/L (ref 38–126)
Anion gap: 10 (ref 5–15)
BUN: 14 mg/dL (ref 8–23)
CO2: 27 mmol/L (ref 22–32)
Calcium: 9.4 mg/dL (ref 8.9–10.3)
Chloride: 102 mmol/L (ref 98–111)
Creatinine, Ser: 0.71 mg/dL (ref 0.61–1.24)
GFR, Estimated: >60 mL/min (ref >60)
Glucose, Bld: 98 mg/dL (ref 70–99)
Potassium: 4.2 mmol/L (ref 3.5–5.1)
Sodium: 139 mmol/L (ref 135–145)
Total Bilirubin: 0.6 mg/dL (ref 0.3–1.2)
Total Protein: 7.2 g/dL (ref 6.5–8.1)

## 2020-12-14 LAB — CBG MONITORING, ED: Glucose-Capillary: 101 mg/dL — ABNORMAL HIGH (ref 70–99)

## 2020-12-14 MED ORDER — SODIUM CHLORIDE 0.9 % IV SOLN
INTRAVENOUS | Status: DC
  Administered 2020-12-14: 20 mL/h via INTRAVENOUS

## 2020-12-14 NOTE — ED Triage Notes (Signed)
Pt a woke up ay 0400 with numbness in left side of face and forehead. Pt reports a little tingling in both hands. Pt is on eliquis and recently had surgery approx 5 -6 weeks ago.

## 2020-12-14 NOTE — ED Provider Notes (Signed)
[facility] EMERGENCY DEPT Provider Note   CSN: [id number] Arrival date & time: 12/14/20  1914     History Chief Complaint  Patient presents with   Numbness    Jeff Perez is a 60 y.o. male.  60 year old male presents with onset of left-sided paresthesias which he first noticed this morning when he woke up.  No headache.  No visual changes.  No peripheral weakness.  Denies any ataxia.  States that the left side of his tongue does feel strange.  Does have a history of left-sided Bell's palsy in the past as well as a CVA.  Has not noticing a facial droop.  Denies any confusion.  No change to his speech.  Came in for further evaluation.  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      Past Medical History:  Diagnosis Date   Allergy    Bell's palsy    Hyperglycemia    Hyperlipidemia     Patient Active Problem List   Diagnosis Date Noted   Radiculopathy due to lumbar intervertebral disc disorder 05/02/2020   Left-sided Bell's palsy 01/18/2019   CVA (cerebral vascular accident) ([facility]) 12/07/2018   Bell palsy 12/06/2018   Hyperlipidemia 12/06/2018   Foreign body, granuloma, skin 12/21/2015    Past Surgical History:  Procedure Laterality Date   BACK SURGERY     VASECTOMY         Family History  Problem Relation Age of Onset   Diabetes Mother    Stroke Mother    Prostate cancer Father    Cancer Maternal Grandfather    Diabetes Sister     Social History   Tobacco Use   Smoking status: Never   Smokeless tobacco: Never  Substance Use Topics   Alcohol use: Yes    Comment: social   Drug use: Not Currently    Home Medications Prior to Admission medications   Medication Sig Start Date End Date Taking? Authorizing Provider  APIXABAN Everlene Balls) VTE STARTER PACK (10MG  AND 5MG ) Take as directed on package: start with two-5mg  tablets twice daily for 7 days. On day 8, switch to one-5mg  tablet twice daily. 11/11/20   , MD  artificial tears  (LACRILUBE) OINT ophthalmic ointment Place into the left eye at bedtime as needed for dry eyes. 12/07/18   Terald Sleeper, MD  atorvastatin (LIPITOR) 40 MG tablet Take 40 mg by mouth daily. 11/11/18   [provider]  predniSONE (DELTASONE) 10 MG tablet Take 2 tablets (20 mg total) by mouth daily with breakfast. 11/10/19   Persons, 01/11/19, [place]  predniSONE (DELTASONE) 10 MG tablet Take 2 tablets (20 mg total) by mouth daily with breakfast. 05/17/20   West Bali, MD    Allergies    Patient has no known allergies.  Review of Systems   Review of Systems  All other systems reviewed and are negative.  Physical Exam Updated Vital Signs BP (!) 171/95 (BP Location: Right Arm)   Pulse 71   Temp 98.2 F (36.8 C) (Oral)   Resp 17   Ht 1.803 m (5\' 11" )   Wt 97.5 kg   SpO2 99%   BMI 29.99 kg/m   Physical Exam Vitals and nursing note reviewed.  Constitutional:      General: He is not in acute distress.    Appearance: Normal appearance. He is well-developed. He is not toxic-appearing.  HENT:     Head: Normocephalic and atraumatic.  Eyes:     General:  Lids are normal.     Conjunctiva/sclera: Conjunctivae normal.     Pupils: Pupils are equal, round, and reactive to light.  Neck:     Thyroid: No thyroid mass.     Trachea: No tracheal deviation.  Cardiovascular:     Rate and Rhythm: Normal rate and regular rhythm.     Heart sounds: Normal heart sounds. No murmur heard.   No gallop.  Pulmonary:     Effort: Pulmonary effort is normal. No respiratory distress.     Breath sounds: Normal breath sounds. No stridor. No decreased breath sounds, wheezing, rhonchi or rales.  Abdominal:     General: There is no distension.     Palpations: Abdomen is soft.     Tenderness: There is no abdominal tenderness. There is no rebound.  Musculoskeletal:        General: No tenderness. Normal range of motion.     Cervical back: Normal range of motion and neck supple.  Skin:    General: Skin is warm  and dry.     Findings: No abrasion or rash.  Neurological:     General: No focal deficit present.     Mental Status: He is alert and oriented to person, place, and time. Mental status is at baseline.     GCS: GCS eye subscore is 4. GCS verbal subscore is 5. GCS motor subscore is 6.     Cranial Nerves: Cranial nerves are intact. No cranial nerve deficit.     Sensory: No sensory deficit.     Motor: Motor function is intact.     Comments: Speech normal.  Psychiatric:        Attention and Perception: Attention normal.        Speech: Speech normal.        Behavior: Behavior normal.    ED Results / Procedures / Treatments   Labs (all labs ordered are listed, but only abnormal results are displayed) Labs Reviewed  CBG MONITORING, ED - Abnormal; Notable for the following components:      Result Value   Glucose-Capillary 101 (*)    All other components within normal limits  CBC WITH DIFFERENTIAL/PLATELET  COMPREHENSIVE METABOLIC PANEL    EKG EKG Interpretation  Date/Time:  Friday December 14 2020 08:40:05 EDT Ventricular Rate:  67 PR Interval:  196 QRS Duration: 101 QT Interval:  421 QTC Calculation: 445 R Axis:   -45 Text Interpretation: Sinus rhythm Left axis deviation Anteroseptal infarct, old Confirmed by Lorre Nick ([id number]) on 12/14/2020 8:54:53 AM  Radiology No results found.  Procedures Procedures   Medications Ordered in ED Medications  0.9 %  sodium chloride infusion (has no administration in time range)    ED Course  I have reviewed the triage vital signs and the nursing notes.  Pertinent labs & imaging results that were available during my care of the patient were reviewed by me and considered in my medical decision making (see chart for details).    MDM Rules/Calculators/A&P                           Head CT without acute findings here.  Patient has no focal deficits on his neurological assessment.  Labs are normal here.  Will discharge home and he  will follow-up with his neurologist.  Return precautions given Final Clinical Impression(s) / ED Diagnoses Final diagnoses:  None    Rx / DC Orders ED Discharge Orders  None        Lorre Nick, MD 12/14/20 1139

## 2020-12-14 NOTE — Discharge Instructions (Addendum)
Go to [facility] if you develop new onset of weakness, facial droop, slurred speech, or if you are concerned that you just do not feel right

## 2020-12-19 ENCOUNTER — Telehealth: Payer: Self-pay | Admitting: Neurology

## 2020-12-19 NOTE — Telephone Encounter (Signed)
Pt called states his symptoms are getting much worse, and would like to come in. Pt requesting a call back.

## 2020-12-19 NOTE — Telephone Encounter (Signed)
Spoke to Dr Terrace Arabia, suggested we schedule pt with NP, offered pt 2 appt's this month, pt refused both appt times. Added pt to wait list

## 2021-05-16 ENCOUNTER — Ambulatory Visit: Payer: Managed Care, Other (non HMO) | Admitting: Family Medicine

## 2022-12-08 ENCOUNTER — Other Ambulatory Visit: Payer: Self-pay | Admitting: Urology

## 2022-12-08 DIAGNOSIS — R972 Elevated prostate specific antigen [PSA]: Secondary | ICD-10-CM

## 2023-01-19 ENCOUNTER — Ambulatory Visit
Admission: RE | Admit: 2023-01-19 | Discharge: 2023-01-19 | Disposition: A | Discharge status: Home / Self Care | Payer: Managed Care, Other (non HMO) | Source: Ambulatory Visit | Attending: Urology | Admitting: Urology

## 2023-01-19 DIAGNOSIS — R972 Elevated prostate specific antigen [PSA]: Secondary | ICD-10-CM

## 2023-01-19 MED ORDER — GADOPICLENOL 0.5 MMOL/ML IV SOLN
10.0000 mL | Freq: Once | INTRAVENOUS | Status: AC | PRN
  Administered 2023-01-19: 10 mL via INTRAVENOUS
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
